# Patient Record
Sex: Male | Born: 1975
Health system: Southern US, Community
[De-identification: ages and names within clinical notes are randomized; demographics above are authoritative.]

## PROBLEM LIST (undated history)

## (undated) DIAGNOSIS — E7800 Pure hypercholesterolemia, unspecified: Secondary | ICD-10-CM

## (undated) DIAGNOSIS — S36039A Unspecified laceration of spleen, initial encounter: Secondary | ICD-10-CM

## (undated) HISTORY — DX: Pure hypercholesterolemia, unspecified: E78.00

## (undated) HISTORY — PX: FRENULECTOMY, LINGUAL: SHX1681

## (undated) HISTORY — PX: OTHER SURGICAL HISTORY: SHX169

## (undated) HISTORY — DX: Unspecified laceration of spleen, initial encounter: S36.039A

---

## 1997-10-20 ENCOUNTER — Emergency Department (HOSPITAL_COMMUNITY): Admission: EM | Admit: 1997-10-20 | Discharge: 1997-10-21 | Payer: Self-pay | Admitting: Emergency Medicine

## 1997-10-21 ENCOUNTER — Encounter: Payer: Self-pay | Admitting: Emergency Medicine

## 1998-07-10 ENCOUNTER — Emergency Department (HOSPITAL_COMMUNITY): Admission: EM | Admit: 1998-07-10 | Discharge: 1998-07-10 | Payer: Self-pay | Admitting: Internal Medicine

## 1999-02-11 ENCOUNTER — Emergency Department (HOSPITAL_COMMUNITY): Admission: EM | Admit: 1999-02-11 | Discharge: 1999-02-11 | Payer: Self-pay | Admitting: *Deleted

## 1999-05-22 ENCOUNTER — Emergency Department (HOSPITAL_COMMUNITY): Admission: EM | Admit: 1999-05-22 | Discharge: 1999-05-22 | Payer: Self-pay | Admitting: Internal Medicine

## 1999-12-25 ENCOUNTER — Emergency Department (HOSPITAL_COMMUNITY): Admission: EM | Admit: 1999-12-25 | Discharge: 1999-12-25 | Payer: Self-pay | Admitting: Internal Medicine

## 2002-07-17 ENCOUNTER — Emergency Department (HOSPITAL_COMMUNITY): Admission: EM | Admit: 2002-07-17 | Discharge: 2002-07-17 | Payer: Self-pay | Admitting: Emergency Medicine

## 2002-12-16 ENCOUNTER — Emergency Department (HOSPITAL_COMMUNITY): Admission: EM | Admit: 2002-12-16 | Discharge: 2002-12-16 | Payer: Self-pay | Admitting: Emergency Medicine

## 2003-04-03 ENCOUNTER — Emergency Department (HOSPITAL_COMMUNITY): Admission: EM | Admit: 2003-04-03 | Discharge: 2003-04-03 | Payer: Self-pay | Admitting: Family Medicine

## 2003-04-09 ENCOUNTER — Emergency Department (HOSPITAL_COMMUNITY): Admission: AD | Admit: 2003-04-09 | Discharge: 2003-04-09 | Payer: Self-pay | Admitting: Family Medicine

## 2004-07-25 ENCOUNTER — Emergency Department (HOSPITAL_COMMUNITY): Admission: EM | Admit: 2004-07-25 | Discharge: 2004-07-25 | Payer: Self-pay | Admitting: Family Medicine

## 2005-01-17 HISTORY — PX: ORIF PELVIC FRACTURE: SUR948

## 2005-11-10 ENCOUNTER — Emergency Department (HOSPITAL_COMMUNITY): Admission: EM | Admit: 2005-11-10 | Discharge: 2005-11-10 | Payer: Self-pay | Admitting: Family Medicine

## 2006-08-02 ENCOUNTER — Inpatient Hospital Stay (HOSPITAL_COMMUNITY): Admission: EM | Admit: 2006-08-02 | Discharge: 2006-08-07 | Payer: Self-pay | Admitting: Emergency Medicine

## 2006-10-26 ENCOUNTER — Ambulatory Visit (HOSPITAL_COMMUNITY): Admission: RE | Admit: 2006-10-26 | Discharge: 2006-10-26 | Payer: Self-pay | Admitting: Orthopedic Surgery

## 2007-06-09 ENCOUNTER — Emergency Department (HOSPITAL_COMMUNITY): Admission: EM | Admit: 2007-06-09 | Discharge: 2007-06-09 | Payer: Self-pay | Admitting: Emergency Medicine

## 2008-03-10 ENCOUNTER — Emergency Department (HOSPITAL_COMMUNITY): Admission: EM | Admit: 2008-03-10 | Discharge: 2008-03-10 | Payer: Self-pay | Admitting: Emergency Medicine

## 2009-09-20 ENCOUNTER — Emergency Department (HOSPITAL_COMMUNITY): Admission: EM | Admit: 2009-09-20 | Discharge: 2009-09-20 | Payer: Self-pay | Admitting: Emergency Medicine

## 2010-03-27 ENCOUNTER — Emergency Department (HOSPITAL_BASED_OUTPATIENT_CLINIC_OR_DEPARTMENT_OTHER)
Admission: EM | Admit: 2010-03-27 | Discharge: 2010-03-27 | Disposition: A | Discharge status: Home / Self Care | Payer: Self-pay | Attending: Emergency Medicine | Admitting: Emergency Medicine

## 2010-03-27 DIAGNOSIS — F17200 Nicotine dependence, unspecified, uncomplicated: Secondary | ICD-10-CM | POA: Insufficient documentation

## 2010-03-27 DIAGNOSIS — L089 Local infection of the skin and subcutaneous tissue, unspecified: Secondary | ICD-10-CM | POA: Insufficient documentation

## 2010-05-04 LAB — COMPREHENSIVE METABOLIC PANEL
CO2: 27 mEq/L (ref 19–32)
Calcium: 9.3 mg/dL (ref 8.4–10.5)
Creatinine, Ser: 0.64 mg/dL (ref 0.4–1.5)
GFR calc Af Amer: >60 mL/min (ref >60)
GFR calc non Af Amer: >60 mL/min (ref >60)
Glucose, Bld: 79 mg/dL (ref 70–99)

## 2010-05-04 LAB — CBC
MCHC: 34.2 g/dL (ref 30.0–36.0)
MCV: 88 fL (ref 78.0–100.0)
RBC: 5.47 MIL/uL (ref 4.22–5.81)
RDW: 13.2 % (ref 11.5–15.5)

## 2010-05-04 LAB — APTT: aPTT: 30 seconds (ref 24–37)

## 2010-05-04 LAB — DIFFERENTIAL
Lymphocytes Relative: 35 % (ref 12–46)
Lymphs Abs: 2.4 10*3/uL (ref 0.7–4.0)
Neutrophils Relative %: 55 % (ref 43–77)

## 2010-05-04 LAB — PROTIME-INR
INR: 1 (ref 0.00–1.49)
Prothrombin Time: 13 seconds (ref 11.6–15.2)
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
## 2010-10-28 LAB — CBC
Hemoglobin: 14.9
RBC: 5.24
RDW: 13.9

## 2010-10-28 LAB — BASIC METABOLIC PANEL
Calcium: 9.1
GFR calc Af Amer: >60
GFR calc non Af Amer: >60
Sodium: 137

## 2010-10-28 LAB — PROTIME-INR: INR: 0.9

## 2010-11-01 LAB — TYPE AND SCREEN: Antibody Screen: NEGATIVE

## 2010-11-01 LAB — BASIC METABOLIC PANEL
BUN: 3 — ABNORMAL LOW
BUN: 6
BUN: 7
CO2: 29
CO2: 29
CO2: 31
Calcium: 7.9 — ABNORMAL LOW
Calcium: 8 — ABNORMAL LOW
Calcium: 8.5
Chloride: 100
Chloride: 103
Chloride: 99
Creatinine, Ser: 0.63
Creatinine, Ser: 0.67
Creatinine, Ser: 0.7
Creatinine, Ser: 0.71
GFR calc Af Amer: >60
GFR calc Af Amer: >60
GFR calc non Af Amer: >60
GFR calc non Af Amer: >60
Potassium: 3.8
Potassium: 3.9
Potassium: 4.1
Sodium: 134 — ABNORMAL LOW
Sodium: 135

## 2010-11-01 LAB — CBC
HCT: 28.2 — ABNORMAL LOW
HCT: 34.1 — ABNORMAL LOW
HCT: 35.5 — ABNORMAL LOW
HCT: 38.2 — ABNORMAL LOW
Hemoglobin: 13.2
Hemoglobin: 9.7 — ABNORMAL LOW
Hemoglobin: 9.7 — ABNORMAL LOW
MCHC: 33.9
MCHC: 33.9
MCHC: 34.3
MCHC: 34.3
MCHC: 34.4
MCV: 86.4
MCV: 86.4
MCV: 86.8
MCV: 86.8
Platelets: 146 — ABNORMAL LOW
Platelets: 149 — ABNORMAL LOW
Platelets: 169
Platelets: 176
Platelets: 218
RBC: 3.27 — ABNORMAL LOW
RBC: 3.29 — ABNORMAL LOW
RBC: 3.95 — ABNORMAL LOW
RBC: 4.4
RBC: 4.74
RDW: 13.3
WBC: 10.2
WBC: 6.5
WBC: 6.5
WBC: 7.3
WBC: 8.9

## 2010-11-01 LAB — I-STAT 8, (EC8 V) (CONVERTED LAB)
Acid-base deficit: 1
Chloride: 103
HCT: 48
Operator id: 198171
Potassium: 3.4 — ABNORMAL LOW
pCO2, Ven: 55.1 — ABNORMAL HIGH

## 2010-11-01 LAB — URINALYSIS, ROUTINE W REFLEX MICROSCOPIC
Ketones, ur: 40 — AB
Nitrite: NEGATIVE
Protein, ur: NEGATIVE
pH: 8

## 2010-11-01 LAB — URINE CULTURE: Special Requests: NEGATIVE

## 2010-11-01 LAB — ABO/RH: ABO/RH(D): A POS

## 2010-11-01 LAB — PROTIME-INR: Prothrombin Time: 14

## 2010-11-01 LAB — POCT I-STAT CREATININE: Operator id: 198171

## 2012-02-08 ENCOUNTER — Encounter (HOSPITAL_BASED_OUTPATIENT_CLINIC_OR_DEPARTMENT_OTHER): Payer: Self-pay | Admitting: *Deleted

## 2012-02-08 ENCOUNTER — Emergency Department (HOSPITAL_BASED_OUTPATIENT_CLINIC_OR_DEPARTMENT_OTHER)
Admission: EM | Admit: 2012-02-08 | Discharge: 2012-02-08 | Disposition: A | Discharge status: Home / Self Care | Payer: Self-pay | Attending: Emergency Medicine | Admitting: Emergency Medicine

## 2012-02-08 DIAGNOSIS — R05 Cough: Secondary | ICD-10-CM | POA: Insufficient documentation

## 2012-02-08 DIAGNOSIS — Z87891 Personal history of nicotine dependence: Secondary | ICD-10-CM | POA: Insufficient documentation

## 2012-02-08 DIAGNOSIS — J029 Acute pharyngitis, unspecified: Secondary | ICD-10-CM | POA: Insufficient documentation

## 2012-02-08 DIAGNOSIS — J209 Acute bronchitis, unspecified: Secondary | ICD-10-CM | POA: Insufficient documentation

## 2012-02-08 DIAGNOSIS — R059 Cough, unspecified: Secondary | ICD-10-CM | POA: Insufficient documentation

## 2012-02-08 DIAGNOSIS — J069 Acute upper respiratory infection, unspecified: Secondary | ICD-10-CM | POA: Insufficient documentation

## 2012-02-08 MED ORDER — AZITHROMYCIN 250 MG PO TABS: ORAL_TABLET | ORAL | Status: DC

## 2012-02-08 NOTE — ED Provider Notes (Signed)
History     CSN: [id number]  Arrival date & time 02/08/12  8315   First MD Initiated Contact with Patient 02/08/12 0940      Chief Complaint  Patient presents with  . URI    (Consider location/radiation/quality/duration/timing/severity/associated sxs/prior treatment) Patient is a 37 y.o. male presenting with URI. The history is provided by the patient and medical records. No language interpreter was used.  URI The primary symptoms include sore throat and cough. Primary symptoms do not include fever. The current episode started 3 to 5 days ago. This is a new problem. The problem has not changed since onset. The sore throat began more than 2 days ago. The sore throat has been unchanged since its onset. The sore throat is mild in intensity.  The cough began 3 to 5 days ago. The cough is new. The cough is non-productive.  The onset of the illness is associated with exposure to sick contacts (His daughter has "walking pneumonia."). The following treatments were addressed: A decongestant was effective.    History reviewed. No pertinent past medical history.  Past Surgical History  Procedure Date  . Orif pelvic fracture   . Frenulectomy, lingual     History reviewed. No pertinent family history.  History  Substance Use Topics  . Smoking status: Former Research scientist (life sciences)  . Smokeless tobacco: Not on file  . Alcohol Use: No      Review of Systems  Constitutional: Negative.  Negative for fever.  HENT: Positive for sore throat.   Eyes: Negative.   Respiratory: Positive for cough.   Cardiovascular: Negative.   Gastrointestinal: Negative.   Genitourinary: Negative.   Musculoskeletal: Negative.   Neurological: Negative.   Psychiatric/Behavioral: Negative.     Allergies  Review of patient's allergies indicates no known allergies.  Home Medications   Current Outpatient Rx  Name  Route  Sig  Dispense  Refill  . NYQUIL PO   Oral   Take by mouth.           BP 126/89  Pulse 60   Temp 98.1 F (36.7 C) (Oral)  Resp 18  Ht 5' 9"  (1.753 m)  Wt 160 lb (72.576 kg)  BMI 23.63 kg/m2  SpO2 100%  Physical Exam  Nursing note and vitals reviewed. Constitutional: He is oriented to person, place, and time. He appears well-developed and well-nourished. No distress.  HENT:  Head: Normocephalic and atraumatic.  Right Ear: External ear normal.       Throat red, no exudate.  Eyes: Conjunctivae normal and EOM are normal. Pupils are equal, round, and reactive to light.  Neck: Normal range of motion. Neck supple.  Cardiovascular: Normal rate, regular rhythm and normal heart sounds.   Pulmonary/Chest: Effort normal and breath sounds normal.  Abdominal: Soft. Bowel sounds are normal.  Musculoskeletal: Normal range of motion.  Lymphadenopathy:    He has cervical adenopathy.  Neurological: He is alert and oriented to person, place, and time.       No sensory or motor deficit.  Skin: Skin is warm and dry.  Psychiatric: He has a normal mood and affect. His behavior is normal.    ED Course  Procedures (including critical care time)  Rx for bronchitis with Z-Pak.    1. URI (upper respiratory infection)   2. Acute bronchitis           Mylinda Latina III, MD 02/08/12 [phone]

## 2012-02-08 NOTE — ED Notes (Signed)
Patient states he has had sinus congestion, headache and sore throat for the last 4 days.  Denies fever.  Intermittent non productive cough.  Using OTC cold meds with no relief.

## 2012-05-30 ENCOUNTER — Encounter (HOSPITAL_BASED_OUTPATIENT_CLINIC_OR_DEPARTMENT_OTHER): Payer: Self-pay | Admitting: *Deleted

## 2012-05-30 ENCOUNTER — Emergency Department (HOSPITAL_BASED_OUTPATIENT_CLINIC_OR_DEPARTMENT_OTHER): Payer: Self-pay

## 2012-05-30 ENCOUNTER — Emergency Department (HOSPITAL_BASED_OUTPATIENT_CLINIC_OR_DEPARTMENT_OTHER)
Admission: EM | Admit: 2012-05-30 | Discharge: 2012-05-30 | Disposition: A | Discharge status: Home / Self Care | Payer: Self-pay | Attending: Emergency Medicine | Admitting: Emergency Medicine

## 2012-05-30 DIAGNOSIS — S61209A Unspecified open wound of unspecified finger without damage to nail, initial encounter: Secondary | ICD-10-CM | POA: Insufficient documentation

## 2012-05-30 DIAGNOSIS — Z79899 Other long term (current) drug therapy: Secondary | ICD-10-CM | POA: Insufficient documentation

## 2012-05-30 DIAGNOSIS — Z87891 Personal history of nicotine dependence: Secondary | ICD-10-CM | POA: Insufficient documentation

## 2012-05-30 DIAGNOSIS — Y939 Activity, unspecified: Secondary | ICD-10-CM | POA: Insufficient documentation

## 2012-05-30 DIAGNOSIS — S61012A Laceration without foreign body of left thumb without damage to nail, initial encounter: Secondary | ICD-10-CM

## 2012-05-30 DIAGNOSIS — W298XXA Contact with other powered powered hand tools and household machinery, initial encounter: Secondary | ICD-10-CM | POA: Insufficient documentation

## 2012-05-30 DIAGNOSIS — Y929 Unspecified place or not applicable: Secondary | ICD-10-CM | POA: Insufficient documentation

## 2012-05-30 MED ORDER — LIDOCAINE HCL 2 % IJ SOLN
INTRAMUSCULAR | Status: AC
  Administered 2012-05-30: 19:00:00
  Filled 2012-05-30: qty 20

## 2012-05-30 MED ORDER — OXYCODONE-ACETAMINOPHEN 5-325 MG PO TABS: 2.0000 | ORAL_TABLET | ORAL | Status: DC | PRN

## 2012-05-30 MED ORDER — SULFAMETHOXAZOLE-TRIMETHOPRIM 800-160 MG PO TABS: 1.0000 | ORAL_TABLET | Freq: Two times a day (BID) | ORAL | Status: DC

## 2012-05-30 MED ORDER — HYDROCODONE-ACETAMINOPHEN 5-325 MG PO TABS: 2.0000 | ORAL_TABLET | ORAL | Status: DC | PRN

## 2012-05-30 NOTE — ED Notes (Signed)
Pt reports laceration to left thumb by chainsaw x 1 hr ago

## 2012-05-30 NOTE — ED Provider Notes (Signed)
History     CSN: [id number]  Arrival date & time 05/30/12  1649   None     Chief Complaint  Patient presents with  . Laceration    (Consider location/radiation/quality/duration/timing/severity/associated sxs/prior treatment) Patient is a 37 y.o. male presenting with skin laceration. The history is provided by the patient. No language interpreter was used.  Laceration Location:  Finger Length (cm):  1 Quality: stellate   Bleeding: controlled with pressure   Injury mechanism: chain saw. Tetanus status:  Up to date Pt cut end of finger with a chainsaw  History reviewed. No pertinent past medical history.  Past Surgical History  Procedure Laterality Date  . Orif pelvic fracture    . Frenulectomy, lingual    .  plevis      History reviewed. No pertinent family history.  History  Substance Use Topics  . Smoking status: Former Research scientist (life sciences)  . Smokeless tobacco: Not on file  . Alcohol Use: No      Review of Systems  Skin: Positive for wound.  All other systems reviewed and are negative.    Allergies  Review of patient's allergies indicates no known allergies.  Home Medications   Current Outpatient Rx  Name  Route  Sig  Dispense  Refill  . azithromycin (ZITHROMAX Z-PAK) 250 MG tablet      Take 2 tablets today, then take 1 tablet once a day for the next 4 days.   6 tablet   0   . Pseudoeph-Doxylamine-DM-APAP (NYQUIL PO)   Oral   Take by mouth.           BP 140/100  Pulse 92  Temp(Src) 99.2 F (37.3 C) (Oral)  Resp 16  Ht 5' 9"  (1.753 m)  Wt 165 lb (74.844 kg)  BMI 24.36 kg/m2  SpO2 100%  Physical Exam  Nursing note and vitals reviewed. Constitutional: He is oriented to person, place, and time. He appears well-developed and well-nourished.  HENT:  Head: Normocephalic.  Musculoskeletal: He exhibits tenderness.  Irregular ugly laceration thumb,  nv intact  Neurological: He is alert and oriented to person, place, and time. He has normal reflexes.   Skin: Skin is warm.  Psychiatric: He has a normal mood and affect.    ED Course  LACERATION REPAIR Date/Time: 05/30/2012 6:45 PM Performed by: Fransico Meadow Authorized by: Fransico Meadow Consent: Verbal consent obtained. Risks and benefits: risks, benefits and alternatives were discussed Consent given by: patient Patient identity confirmed: verbally with patient Time out: Immediately prior to procedure a "time out" was called to verify the correct patient, procedure, equipment, support staff and site/side marked as required. Body area: upper extremity Location details: left thumb Anesthesia: local infiltration and digital block Subcutaneous closure: 5-0 Vicryl Number of sutures: 5 Technique: simple Approximation: close Approximation difficulty: simple Patient tolerance: Patient tolerated the procedure well with no immediate complications.   (including critical care time)  Labs Reviewed - No data to display No results found.   1. Laceration of left thumb with complication, initial encounter       MDM  Recheck in 2 days,   Rx for percocet and bactrim        Fransico Meadow, PA-C 05/30/12 1846

## 2012-05-30 NOTE — ED Provider Notes (Signed)
Medical screening examination/treatment/procedure(s) were performed by non-physician practitioner and as supervising physician I was immediately available for consultation/collaboration.   Malvin Johns, MD 05/30/12 2038

## 2012-10-03 ENCOUNTER — Emergency Department (HOSPITAL_BASED_OUTPATIENT_CLINIC_OR_DEPARTMENT_OTHER)
Admission: EM | Admit: 2012-10-03 | Discharge: 2012-10-03 | Disposition: A | Discharge status: Home / Self Care | Payer: Self-pay | Attending: Emergency Medicine | Admitting: Emergency Medicine

## 2012-10-03 ENCOUNTER — Encounter (HOSPITAL_BASED_OUTPATIENT_CLINIC_OR_DEPARTMENT_OTHER): Payer: Self-pay

## 2012-10-03 DIAGNOSIS — M5430 Sciatica, unspecified side: Secondary | ICD-10-CM | POA: Insufficient documentation

## 2012-10-03 DIAGNOSIS — Z87891 Personal history of nicotine dependence: Secondary | ICD-10-CM | POA: Insufficient documentation

## 2012-10-03 DIAGNOSIS — M5431 Sciatica, right side: Secondary | ICD-10-CM

## 2012-10-03 DIAGNOSIS — M545 Low back pain: Secondary | ICD-10-CM

## 2012-10-03 MED ORDER — CYCLOBENZAPRINE HCL 10 MG PO TABS: 10.0000 mg | ORAL_TABLET | Freq: Two times a day (BID) | ORAL | Status: DC | PRN

## 2012-10-03 MED ORDER — OXYCODONE-ACETAMINOPHEN 5-325 MG PO TABS: 1.0000 | ORAL_TABLET | Freq: Four times a day (QID) | ORAL | Status: DC | PRN

## 2012-10-03 MED ORDER — METHYLPREDNISOLONE 4 MG PO KIT: PACK | ORAL | Status: DC

## 2012-10-03 NOTE — ED Notes (Signed)
Pt reports low back pain that started Monday am.  Denies injury.

## 2012-10-03 NOTE — ED Provider Notes (Addendum)
CSN: [id number]     Arrival date & time 10/03/12  30 History   First MD Initiated Contact with Patient 10/03/12 1104     Chief Complaint  Patient presents with  . Back Pain   (Consider location/radiation/quality/duration/timing/severity/associated sxs/prior Treatment) Patient is a 37 y.o. male presenting with back pain. The history is provided by the patient.  Back Pain Location:  Lumbar spine Quality:  Aching, stiffness and stabbing Stiffness is present:  All day Radiates to:  R thigh Pain severity:  Moderate Pain is:  Same all the time Onset quality:  Gradual Duration:  3 days Timing:  Constant Progression:  Worsening Chronicity:  New Context: lifting heavy objects and twisting   Relieved by:  Nothing Worsened by:  Bending and lying down Ineffective treatments:  Bed rest, heating pad, ibuprofen and narcotics Associated symptoms: no abdominal pain, no bladder incontinence, no bowel incontinence, no fever, no numbness, no tingling and no weakness   Associated symptoms comment:  No dysuria  Risk factors: no hx of cancer     History reviewed. No pertinent past medical history. Past Surgical History  Procedure Laterality Date  . Orif pelvic fracture    . Frenulectomy, lingual    .  plevis     No family history on file. History  Substance Use Topics  . Smoking status: Former Research scientist (life sciences)  . Smokeless tobacco: Not on file  . Alcohol Use: No    Review of Systems  Constitutional: Negative for fever.  Respiratory: Negative for cough and shortness of breath.   Gastrointestinal: Negative for abdominal pain and bowel incontinence.  Genitourinary: Negative for bladder incontinence.  Musculoskeletal: Positive for back pain.  Neurological: Negative for tingling, weakness and numbness.  All other systems reviewed and are negative.    Allergies  Review of patient's allergies indicates no known allergies.  Home Medications   Current Outpatient Rx  Name  Route  Sig  Dispense   Refill  . oxyCODONE-acetaminophen (PERCOCET/ROXICET) 5-325 MG per tablet   Oral   Take 2 tablets by mouth every 4 (four) hours as needed for pain.   16 tablet   0    BP 144/91  Pulse 86  Temp(Src) 97.5 F (36.4 C) (Oral)  Resp 14  Ht 5' 9"  (1.753 m)  Wt 155 lb (70.308 kg)  BMI 22.88 kg/m2  SpO2 96% Physical Exam  Nursing note and vitals reviewed. Constitutional: He is oriented to person, place, and time. He appears well-developed and well-nourished. No distress.  HENT:  Head: Normocephalic and atraumatic.  Mouth/Throat: Oropharynx is clear and moist.  Eyes: EOM are normal. Pupils are equal, round, and reactive to light.  Neck: Normal range of motion. Neck supple. No spinous process tenderness and no muscular tenderness present. No rigidity. Normal range of motion present.  Cardiovascular: Normal rate, regular rhythm, normal heart sounds and intact distal pulses.   No murmur heard. Pulmonary/Chest: Effort normal and breath sounds normal. He has no wheezes. He has no rales.  Abdominal: Soft. He exhibits no distension. There is no tenderness. There is no CVA tenderness.  Musculoskeletal: He exhibits no tenderness.       Lumbar back: He exhibits decreased range of motion and pain. He exhibits no swelling, no deformity and normal pulse.       Back:  Neurological: He is alert and oriented to person, place, and time. He has normal strength. No sensory deficit. Coordination normal.  Reflex Scores:      Patellar reflexes are 1+ on  the right side and 1+ on the left side. Skin: Skin is warm and dry. No rash noted.  Psychiatric: He has a normal mood and affect.    ED Course  Procedures (including critical care time) Labs Review Labs Reviewed - No data to display Imaging Review No results found.  MDM   1. Sciatica of right side   2. Lumbar pain     Pt with gradual onset of back pain suggestive of radiculopathy.  No neurovascular compromise and no incontinence.  Pt has no  infectious sx, hx of CA  or other red flags concerning for pathologic back pain.  Pt is able to ambulate but is painful.  Normal strength and reflexes on exam.  Denies trauma. Will give pt pain control and to return for developement of above sx.     Blanchie Dessert, MD 10/03/12 1118  Blanchie Dessert, MD 10/03/12 1120

## 2013-08-17 ENCOUNTER — Emergency Department (HOSPITAL_COMMUNITY)
Admission: EM | Admit: 2013-08-17 | Discharge: 2013-08-17 | Disposition: A | Discharge status: Home / Self Care | Payer: Self-pay | Attending: Emergency Medicine | Admitting: Emergency Medicine

## 2013-08-17 ENCOUNTER — Encounter (HOSPITAL_COMMUNITY): Payer: Self-pay | Admitting: Emergency Medicine

## 2013-08-17 DIAGNOSIS — M545 Low back pain, unspecified: Secondary | ICD-10-CM | POA: Insufficient documentation

## 2013-08-17 DIAGNOSIS — Z87891 Personal history of nicotine dependence: Secondary | ICD-10-CM | POA: Insufficient documentation

## 2013-08-17 MED ORDER — KETOROLAC TROMETHAMINE 60 MG/2ML IM SOLN
30.0000 mg | Freq: Once | INTRAMUSCULAR | Status: AC
  Administered 2013-08-17: 30 mg via INTRAMUSCULAR
  Filled 2013-08-17: qty 2

## 2013-08-17 MED ORDER — PREDNISONE 20 MG PO TABS: ORAL_TABLET | ORAL | Status: DC

## 2013-08-17 MED ORDER — OXYCODONE-ACETAMINOPHEN 5-325 MG PO TABS: ORAL_TABLET | ORAL | Status: DC

## 2013-08-17 NOTE — ED Notes (Signed)
Lower back pain.  Took Advil with no relief.

## 2013-08-17 NOTE — ED Provider Notes (Signed)
Medical screening examination/treatment/procedure(s) were performed by non-physician practitioner and as supervising physician I was immediately available for consultation/collaboration  Neta Ehlers, MD 08/17/13 1102

## 2013-08-17 NOTE — ED Provider Notes (Signed)
CSN: [id number]     Arrival date & time 08/17/13  7829 History   First MD Initiated Contact with Patient 08/17/13 [phone]     Chief Complaint  Patient presents with  . Back Pain     (Consider location/radiation/quality/duration/timing/severity/associated sxs/prior Treatment) HPI  Devon Cantu is a 38 y.o. male complaining of lower midline back pain worsening over the course of the week. This is atraumatic, no heavy straining or lifting. Patient had back pain in the past but never this severe. He's been taking Advil, total relief. The pain was so severe last night that stopped him from sleeping. Denies fever, chills, change in bowel or bladder habits, h/o IDVU or cancer, numbness or weakness.   History reviewed. No pertinent past medical history. Past Surgical History  Procedure Laterality Date  . Orif pelvic fracture    . Frenulectomy, lingual    .  plevis     No family history on file. History  Substance Use Topics  . Smoking status: Former Research scientist (life sciences)  . Smokeless tobacco: Not on file  . Alcohol Use: No    Review of Systems  10 systems reviewed and found to be negative, except as noted in the HPI.  Allergies  Review of patient's allergies indicates no known allergies.  Home Medications   Prior to Admission medications   Medication Sig Start Date End Date Taking? Authorizing Provider  oxyCODONE-acetaminophen (PERCOCET/ROXICET) 5-325 MG per tablet 1 to 2 tabs PO q6hrs  PRN for pain 08/17/13   Elmyra Ricks Lacharles Altschuler, PA-C  predniSONE (DELTASONE) 20 MG tablet 3 tabs po daily x 3 days, then 2 tabs x 3 days, then 1.5 tabs x 3 days, then 1 tab x 3 days, then 0.5 tabs x 3 days 08/17/13   Elmyra Ricks Zorianna Taliaferro, PA-C   BP 136/87  Pulse 76  Temp(Src) 97.7 F (36.5 C) (Oral)  Resp 18  Ht 5' 9"  (1.753 m)  Wt 155 lb (70.308 kg)  BMI 22.88 kg/m2  SpO2 100% Physical Exam  Nursing note and vitals reviewed. Constitutional: He appears well-developed and well-nourished.  HENT:  Head:  Normocephalic.  Eyes: Conjunctivae are normal.  Neck: Normal range of motion.  Cardiovascular: Normal rate, regular rhythm and intact distal pulses.   Pulmonary/Chest: Effort normal.  Abdominal: Soft. There is no tenderness.  Neurological: He is alert.  No point tenderness to percussion of lumbar spinal processes.  No TTP or paraspinal muscular spasm. Strength is 5 out of 5 to bilateral lower extremities at hip and knee; extensor hallucis longus 5 out of 5. Ankle strength 5 out of 5, no clonus, neurovascularly intact. No saddle anaesthesia. Patellar reflexes are 2+ bilaterally.    Strait leg raise negative bilaterally Ambulates  With mildly antalgic gait   Psychiatric: He has a normal mood and affect.    ED Course  Procedures (including critical care time) Labs Review Labs Reviewed - No data to display  Imaging Review No results found.   EKG Interpretation None      MDM   Final diagnoses:  Midline low back pain without sciatica    Filed Vitals:   08/17/13 0645  BP: 136/87  Pulse: 76  Temp: 97.7 F (36.5 C)  TempSrc: Oral  Resp: 18  Height: 5' 9"  (1.753 m)  Weight: 155 lb (70.308 kg)  SpO2: 100%    Medications  ketorolac (TORADOL) injection 30 mg (30 mg Intramuscular Given 08/17/13 0716)    Devon Cantu is a 38 y.o. male presenting with  back  pain.  No neurological deficits and normal neuro exam.  Patient can walk but states is painful.  No loss of bowel or bladder control.  No concern for cauda equina.  No fever, night sweats, weight loss, h/o cancer, IVDU.  RICE protocol and pain medicine indicated and discussed with patient.   Evaluation does not show pathology that would require ongoing emergent intervention or inpatient treatment. Pt is hemodynamically stable and mentating appropriately. Discussed findings and plan with patient/guardian, who agrees with care plan. All questions answered. Return precautions discussed and outpatient follow up given.    New Prescriptions   OXYCODONE-ACETAMINOPHEN (PERCOCET/ROXICET) 5-325 MG PER TABLET    1 to 2 tabs PO q6hrs  PRN for pain   PREDNISONE (DELTASONE) 20 MG TABLET    3 tabs po daily x 3 days, then 2 tabs x 3 days, then 1.5 tabs x 3 days, then 1 tab x 3 days, then 0.5 tabs x 3 days         Monico Blitz, PA-C 08/17/13 [phone]

## 2013-08-17 NOTE — Discharge Instructions (Signed)
Please take ibuprofen 452m (this is normally 2 over the counter pills) every 6 hours (take with food to minimze stomach irritation).   Take  percocet for breakthrough pain, do not drink alcohol, drive, care for children or perfom other critical tasks while taking percocet.  Please follow with your primary care doctor in the next 2 days for a check-up. They must obtain records for further management.   Do not hesitate to return to the Emergency Department for any new, worsening or concerning symptoms.

## 2016-08-17 ENCOUNTER — Ambulatory Visit (HOSPITAL_COMMUNITY)
Admission: EM | Admit: 2016-08-17 | Discharge: 2016-08-17 | Disposition: A | Discharge status: Home / Self Care | Payer: Self-pay | Attending: Family Medicine | Admitting: Family Medicine

## 2016-08-17 ENCOUNTER — Encounter (HOSPITAL_COMMUNITY): Payer: Self-pay | Admitting: Emergency Medicine

## 2016-08-17 DIAGNOSIS — J029 Acute pharyngitis, unspecified: Secondary | ICD-10-CM

## 2016-08-17 NOTE — ED Triage Notes (Signed)
Sore throat and joint aches for 3 days

## 2016-08-17 NOTE — Discharge Instructions (Signed)
You may use over the counter ibuprofen or acetaminophen as needed.  In addition you may try [employer] Peroxyl gargle solution or Chloraseptic throat spray.

## 2016-08-18 ENCOUNTER — Emergency Department (HOSPITAL_COMMUNITY)
Admission: EM | Admit: 2016-08-18 | Discharge: 2016-08-18 | Disposition: A | Discharge status: Home / Self Care | Payer: Self-pay | Attending: Emergency Medicine | Admitting: Emergency Medicine

## 2016-08-18 ENCOUNTER — Encounter (HOSPITAL_COMMUNITY): Payer: Self-pay

## 2016-08-18 ENCOUNTER — Emergency Department (HOSPITAL_COMMUNITY): Payer: Self-pay

## 2016-08-18 DIAGNOSIS — J029 Acute pharyngitis, unspecified: Secondary | ICD-10-CM | POA: Insufficient documentation

## 2016-08-18 DIAGNOSIS — H65192 Other acute nonsuppurative otitis media, left ear: Secondary | ICD-10-CM | POA: Insufficient documentation

## 2016-08-18 DIAGNOSIS — R509 Fever, unspecified: Secondary | ICD-10-CM

## 2016-08-18 DIAGNOSIS — H6690 Otitis media, unspecified, unspecified ear: Secondary | ICD-10-CM

## 2016-08-18 DIAGNOSIS — F17210 Nicotine dependence, cigarettes, uncomplicated: Secondary | ICD-10-CM | POA: Insufficient documentation

## 2016-08-18 LAB — RAPID STREP SCREEN (MED CTR MEBANE ONLY): STREPTOCOCCUS, GROUP A SCREEN (DIRECT): NEGATIVE

## 2016-08-18 MED ORDER — IBUPROFEN 800 MG PO TABS
800.0000 mg | ORAL_TABLET | Freq: Once | ORAL | Status: AC
  Administered 2016-08-18: 800 mg via ORAL
  Filled 2016-08-18: qty 1

## 2016-08-18 MED ORDER — DOXYCYCLINE HYCLATE 100 MG PO TABS
200.0000 mg | ORAL_TABLET | Freq: Once | ORAL | Status: AC
  Administered 2016-08-18: 200 mg via ORAL
  Filled 2016-08-18: qty 2

## 2016-08-18 MED ORDER — CLINDAMYCIN HCL 150 MG PO CAPS: 300.0000 mg | ORAL_CAPSULE | Freq: Three times a day (TID) | ORAL | 0 refills | Status: AC

## 2016-08-18 MED ORDER — ACETAMINOPHEN 500 MG PO TABS
1000.0000 mg | ORAL_TABLET | Freq: Once | ORAL | Status: AC
  Administered 2016-08-18: 1000 mg via ORAL
  Filled 2016-08-18: qty 2

## 2016-08-18 MED ORDER — DEXAMETHASONE SODIUM PHOSPHATE 10 MG/ML IJ SOLN
10.0000 mg | Freq: Once | INTRAMUSCULAR | Status: AC
  Administered 2016-08-18: 10 mg via INTRAMUSCULAR
  Filled 2016-08-18: qty 1

## 2016-08-18 NOTE — ED Provider Notes (Signed)
[place] DEPT Provider Note   CSN: [id number] Arrival date & time: 08/18/16  1400     History   Chief Complaint Chief Complaint  Patient presents with  . Sore Throat  . Generalized Body Aches    HPI Devon Cantu is a 41 y.o. male.  HPI Patient presents to ED for evaluation of sore throat and body aches for the past 4 days. He was seen at urgent care yesterday and was told he had viral pharyngitis. He is continuing to take the Advil as directed around the clock. He returns today because he is continuing to have sore throat and generalized body aches. He denies any trismus, drooling, neck pain but states that throat pain is worse with swallowing. He is unsure if he has had a fever because he has been taking the Advil but does report some chills for the past 2 days. 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 He denies any sick contacts, chest pain, trouble breathing, trouble swallowing, headache injury, recent travel, neck pain.  History reviewed. No pertinent past medical history.  There are no active problems to display for this patient.   Past Surgical History:  Procedure Laterality Date  .  plevis    . FRENULECTOMY, LINGUAL    . ORIF PELVIC FRACTURE         Home Medications    Prior to Admission medications   Medication Sig Start Date End Date Taking? Authorizing Provider  clindamycin (CLEOCIN) 150 MG capsule Take 2 capsules (300 mg total) by mouth 3 (three) times daily. 08/18/16 08/25/16  Laquan Ludden, PA-C  ibuprofen (ADVIL,MOTRIN) 200 MG tablet Take 200 mg by mouth every 6 (six) hours as needed.    [provider]  oxyCODONE-acetaminophen (PERCOCET/ROXICET) 5-325 MG per tablet 1 to 2 tabs PO q6hrs  PRN for pain 08/17/13   Pisciotta, Elmyra Ricks, PA-C  predniSONE (DELTASONE) 20 MG tablet 3 tabs po daily x 3 days, then 2 tabs x 3 days, then 1.5 tabs x 3 days, then 1 tab x 3 days, then 0.5 tabs x 3 days 08/17/13    Pisciotta, Elmyra Ricks, PA-C    Family History No family history on file.  Social History Social History  Substance Use Topics  . Smoking status: Current Every Day Smoker    Packs/day: 0.50    Types: Cigarettes  . Smokeless tobacco: Never Used  . Alcohol use No     Allergies   Patient has no known allergies.   Review of Systems Review of Systems  Constitutional: Positive for chills, fatigue and fever. Negative for appetite change.  HENT: Positive for sore throat. Negative for sinus pressure, trouble swallowing and voice change.   Eyes: Negative for visual disturbance.  Respiratory: Positive for cough.   Cardiovascular: Negative for chest pain.  Gastrointestinal: Negative for abdominal pain, diarrhea, nausea and vomiting.  Musculoskeletal: Positive for myalgias.  Skin: Negative for rash.  Neurological: Negative for dizziness, weakness, light-headedness, numbness and headaches.     Physical Exam Updated Vital Signs BP (!) 156/95 (BP Location: Left Arm)   Pulse 78   Temp (!) 100.9 F (38.3 C) (Oral)   Resp 16   Ht 5' 9"  (1.753 m)   Wt 70.3 kg (155 lb)   SpO2 100%   BMI 22.89 kg/m   Physical Exam  Constitutional: He appears well-developed and well-nourished. No distress.  HENT:  Head: Normocephalic and atraumatic.  Nose: Nose normal.  Mouth/Throat: Uvula is midline. Posterior oropharyngeal erythema present. No posterior oropharyngeal edema. Tonsillar exudate.  Bilateral tonsillar exudates. Patient does not appear to be in acute distress. No trismus or drooling present. No pooling of secretions. Patient is tolerating secretions and is not in respiratory distress. No neck pain or tenderness to palpation of the neck. Full active and passive range of motion of the neck. No evidence of RPA or PTA.  Eyes: Conjunctivae and EOM are normal. Right eye exhibits no discharge. Left eye exhibits no discharge. No scleral icterus.  Neck: Normal range of motion. Neck supple.    Cardiovascular: Normal rate, regular rhythm, normal heart sounds and intact distal pulses.  Exam reveals no gallop and no friction rub.   No murmur heard. Pulmonary/Chest: Effort normal and breath sounds normal. No respiratory distress.  Abdominal: Soft. Bowel sounds are normal. He exhibits no distension. There is no tenderness. There is no guarding.  Musculoskeletal: Normal range of motion. He exhibits no edema.  Neurological: He is alert. He exhibits normal muscle tone. Coordination normal.  Skin: Skin is warm and dry. No rash noted.  Psychiatric: He has a normal mood and affect.  Nursing note and vitals reviewed.    ED Treatments / Results  Labs (all labs ordered are listed, but only abnormal results are displayed) Labs Reviewed  RAPID STREP SCREEN (NOT AT [facility])  CULTURE, GROUP A STREP [facility])    EKG  EKG Interpretation None       Radiology Dg Chest 2 View  Result Date: 08/18/2016 CLINICAL DATA:  Fever.  Sore throat. EXAM: CHEST  2 VIEW COMPARISON:  No recent prior . FINDINGS: Mediastinum and hilar structures normal. Lungs are clear. Heart size normal. No pleural effusion or pneumothorax. Degenerative changes thoracic spine with mild scoliosis. IMPRESSION: No acute cardiopulmonary disease . Electronically Signed   By: Marcello Moores  Register   On: 08/18/2016 15:52    Procedures Procedures (including critical care time)  Medications Ordered in ED Medications  dexamethasone (DECADRON) injection 10 mg (10 mg Intramuscular Given 08/18/16 1521)  doxycycline (VIBRA-TABS) tablet 200 mg (200 mg Oral Given 08/18/16 1520)  ibuprofen (ADVIL,MOTRIN) tablet 800 mg (800 mg Oral Given 08/18/16 1520)  acetaminophen (TYLENOL) tablet 1,000 mg (1,000 mg Oral Given 08/18/16 1520)     Initial Impression / Assessment and Plan / ED Course  I have reviewed the triage vital signs and the nursing notes.  Pertinent labs & imaging results that were available during my care of the patient were reviewed by me  and considered in my medical decision making (see chart for details).     Patient presents to ED for evaluation of sore throat, generalized body aches and fever. He was seen at urgent care yesterday and has been continuing to take ibuprofen as prescribed. He reports pain with swallowing but no trismus, drooling, neck pain or rashes. He is afebrile to 102.3 here in the ED. There is tonsillar exudates and posterior pharyngeal erythema noted. Uvula is midline. There is no neck tenderness or trouble moving the neck. Strep test was negative. He does have evidence of acute otitis media on the left side. He also complains of productive cough. Chest x-ray was normal. Strep test returned as negative. I have low suspicion for peritonsillar abscess based on symptoms as well as low suspicion for RPA. We'll treat with clindamycin to cover both strep and acute otitis media. Encouraged to continue ibuprofen or Tylenol as needed for fever and pain. 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 given Tylenol and ibuprofen with improvement in temperature to 100.9. Normal heart rate and blood pressure. Patient appears stable for discharge at this time. Strict return precautions given.  Final Clinical Impressions(s) / ED Diagnoses   Final diagnoses:  Pharyngitis, unspecified etiology  Acute otitis media, unspecified otitis media type  Fever, unspecified fever cause  Sore throat    New Prescriptions Discharge Medication List as of 08/18/2016  4:08 PM    START taking these medications   Details  clindamycin (CLEOCIN) 150 MG capsule Take 2 capsules (300 mg total) by mouth 3 (three) times daily., Starting Thu 08/18/2016, Until Thu 08/25/2016, Print         [place], [place], PA-C 08/18/16 Evalee Jefferson    Lajean Saver, MD 08/20/16 [phone]

## 2016-08-18 NOTE — Discharge Instructions (Signed)
Please read attached information regarding your condition. Take clindamycin 3 times daily for 1 week. Continue ibuprofen or Tylenol as needed for fever. Return to ED for worsening pain, trouble swallowing, trouble breathing, drooling, injuries.

## 2016-08-18 NOTE — ED Triage Notes (Signed)
Per Pt, Pt reports sore throat, body aches, and fever x 4 days. Was seen yesterday, but was sent home for OTC medication.

## 2016-08-18 NOTE — ED Notes (Signed)
Body aches and sore throat s several days, has been running a temp

## 2016-08-18 NOTE — ED Notes (Signed)
Declined W/C at D/C and was escorted to lobby by RN. 

## 2016-08-18 NOTE — ED Provider Notes (Signed)
  [place]   [id number] 08/17/16 Arrival Time: 1806  ASSESSMENT & PLAN:  1. Viral pharyngitis     Meds ordered this encounter  Medications  . ibuprofen (ADVIL,MOTRIN) 200 MG tablet    Sig: Take 200 mg by mouth every 6 (six) hours as needed.   If not improving within the next few days will f/u. No sign of bacterial infection.  Reviewed expectations re: course of current medical issues. Questions answered. Outlined signs and symptoms indicating need for more acute intervention. Patient verbalized understanding. After Visit Summary given.   SUBJECTIVE:  Devon Cantu is a 41 y.o. male who presents with complaint of sore throat for 2-3 days. Gradual onset. Body and joint aches. No fever reported. OTC with mild help. Tolerating normal PO intake. No n/v. No sick contacts. No specific aggravating or alleviating factors reported. No neck swelling or pain.  ROS: As per HPI.   OBJECTIVE:  Vitals:   08/17/16 1848  BP: 137/84  Pulse: 97  Resp: 18  Temp: 98.3 F (36.8 C)  TempSrc: Oral  SpO2: 99%     General appearance: alert; no distress HEENT: generalized throat erythema; normal tonsils without exudate Neck: supple without LAD Skin: warm and dry   No Known Allergies  PMHx, SurgHx, SocialHx, Medications, and Allergies were reviewed in the Visit Navigator and updated as appropriate.      Vanessa Kick, MD 08/18/16 1005

## 2016-08-20 LAB — CULTURE, GROUP A STREP (THRC)

## 2016-09-25 ENCOUNTER — Emergency Department (HOSPITAL_BASED_OUTPATIENT_CLINIC_OR_DEPARTMENT_OTHER)
Admission: EM | Admit: 2016-09-25 | Discharge: 2016-09-25 | Disposition: A | Discharge status: Home / Self Care | Payer: Self-pay | Attending: Emergency Medicine | Admitting: Emergency Medicine

## 2016-09-25 ENCOUNTER — Encounter (HOSPITAL_BASED_OUTPATIENT_CLINIC_OR_DEPARTMENT_OTHER): Payer: Self-pay | Admitting: Emergency Medicine

## 2016-09-25 DIAGNOSIS — Y929 Unspecified place or not applicable: Secondary | ICD-10-CM | POA: Insufficient documentation

## 2016-09-25 DIAGNOSIS — S39012A Strain of muscle, fascia and tendon of lower back, initial encounter: Secondary | ICD-10-CM | POA: Insufficient documentation

## 2016-09-25 DIAGNOSIS — Y999 Unspecified external cause status: Secondary | ICD-10-CM | POA: Insufficient documentation

## 2016-09-25 DIAGNOSIS — F17210 Nicotine dependence, cigarettes, uncomplicated: Secondary | ICD-10-CM | POA: Insufficient documentation

## 2016-09-25 DIAGNOSIS — Y93H2 Activity, gardening and landscaping: Secondary | ICD-10-CM | POA: Insufficient documentation

## 2016-09-25 DIAGNOSIS — X509XXA Other and unspecified overexertion or strenuous movements or postures, initial encounter: Secondary | ICD-10-CM | POA: Insufficient documentation

## 2016-09-25 MED ORDER — IBUPROFEN 600 MG PO TABS: 600.0000 mg | ORAL_TABLET | Freq: Four times a day (QID) | ORAL | 0 refills | Status: DC | PRN

## 2016-09-25 MED ORDER — METHOCARBAMOL 500 MG PO TABS
500.0000 mg | ORAL_TABLET | Freq: Once | ORAL | Status: AC
  Administered 2016-09-25: 500 mg via ORAL
  Filled 2016-09-25: qty 1

## 2016-09-25 MED ORDER — IBUPROFEN 400 MG PO TABS
600.0000 mg | ORAL_TABLET | Freq: Once | ORAL | Status: AC
  Administered 2016-09-25: 600 mg via ORAL
  Filled 2016-09-25: qty 1

## 2016-09-25 MED ORDER — METHOCARBAMOL 500 MG PO TABS: 500.0000 mg | ORAL_TABLET | Freq: Three times a day (TID) | ORAL | 0 refills | Status: DC | PRN

## 2016-09-25 NOTE — Discharge Instructions (Signed)
Please take ibuprofen and muscle relaxant for pain. Use heat packs at rest  Avoid heavy lifting or exertional activity.  Return for worsening symptoms, including new numbness or weakness of the legs, escalating pain, or any other symptoms concerning to you.

## 2016-09-25 NOTE — ED Notes (Signed)
ED Provider at bedside. 

## 2016-09-25 NOTE — ED Triage Notes (Signed)
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

## 2016-09-25 NOTE — ED Provider Notes (Signed)
[place] DEPT [facility] Provider Note   CSN: [id number] Arrival date & time: 09/25/16  1314     History   Chief Complaint Chief Complaint  Patient presents with  . Back Pain    HPI Devon Cantu is a 41 y.o. male.  The history is provided by the patient.  Back Pain   This is a new problem. The current episode started more than 2 days ago. The problem occurs constantly. The problem has not changed since onset.Associated with: suddenly standing up while bending down. The pain is present in the lumbar spine. The quality of the pain is described as stabbing. The pain does not radiate. The pain is moderate. The symptoms are aggravated by bending, twisting and certain positions. Pertinent negatives include no chest pain, no fever, no numbness, no abdominal pain, no bowel incontinence, no perianal numbness, no bladder incontinence, no dysuria, no paresis, no tingling and no weakness. He has tried nothing for the symptoms.   41 year old male who presents with low back pain. He states that he was cutting metal on the grass, when he suddenly stood up and felt a pop in his back. Since then has been very sore in the low back and last night with back spasms. Did not have any pain medications. Denies any numbness or weakness in the lower extremity or difficulty walking. Denies any bowel or urinary incontinence or saddle anesthesia. No fevers, nausea or vomiting, abdominal pain, urinary complaints.. No fall or trauma.  History reviewed. No pertinent past medical history.  There are no active problems to display for this patient.   Past Surgical History:  Procedure Laterality Date  .  plevis    . FRENULECTOMY, LINGUAL    . ORIF PELVIC FRACTURE         Home Medications    Prior to Admission medications   Medication Sig Start Date End Date Taking? Authorizing Provider  ibuprofen (ADVIL,MOTRIN) 200 MG tablet Take 200 mg by mouth every 6 (six) hours as needed.    [provider]   ibuprofen (ADVIL,MOTRIN) 600 MG tablet Take 1 tablet (600 mg total) by mouth every 6 (six) hours as needed for moderate pain. 09/25/16   Forde Dandy, MD  methocarbamol (ROBAXIN) 500 MG tablet Take 1 tablet (500 mg total) by mouth every 8 (eight) hours as needed for muscle spasms. 09/25/16   Forde Dandy, MD  oxyCODONE-acetaminophen (PERCOCET/ROXICET) 5-325 MG per tablet 1 to 2 tabs PO q6hrs  PRN for pain 08/17/13   Pisciotta, Elmyra Ricks, PA-C  predniSONE (DELTASONE) 20 MG tablet 3 tabs po daily x 3 days, then 2 tabs x 3 days, then 1.5 tabs x 3 days, then 1 tab x 3 days, then 0.5 tabs x 3 days 08/17/13   Pisciotta, Charna Elizabeth    Family History History reviewed. No pertinent family history.  Social History Social History  Substance Use Topics  . Smoking status: Current Every Day Smoker    Packs/day: 0.50    Types: Cigarettes  . Smokeless tobacco: Never Used  . Alcohol use No     Allergies   Patient has no known allergies.   Review of Systems Review of Systems  Constitutional: Negative for fever.  Cardiovascular: Negative for chest pain.  Gastrointestinal: Negative for abdominal pain and bowel incontinence.  Genitourinary: Negative for bladder incontinence and dysuria.  Musculoskeletal: Positive for back pain.  Skin: Negative for wound.  Allergic/Immunologic: Negative for immunocompromised state.  Neurological: Negative for tingling, weakness and numbness.  Hematological: Does not bruise/bleed easily.     Physical Exam Updated Vital Signs BP 122/85 (BP Location: Right Arm)   Pulse 75   Temp 97.8 F (36.6 C) (Oral)   Resp 18   Ht 5' 9"  (1.753 m)   Wt 70.3 kg (155 lb)   SpO2 100%   BMI 22.89 kg/m   Physical Exam Physical Exam  Nursing note and vitals reviewed. Constitutional: Well developed, well nourished, non-toxic, and in no acute distress Head: Normocephalic and atraumatic.  Mouth/Throat: Oropharynx is clear and moist.  Neck: Normal range of motion. Neck supple.    Cardiovascular: Normal rate and regular rhythm.   Pulmonary/Chest: Effort normal and breath sounds normal.  Abdominal: Soft. There is no tenderness. There is no rebound and no guarding.  no CVA tenderness  Musculoskeletal: Normal range of motion.  tenderness to palpation of the low lumbar spine no deformities or step-offs.  Neurological: Alert, no facial droop, fluent speech, full strength in bilateral hip flexion/extension/abduction/adduction, knee flexion/extension, ankle dorsi/plantar flexion. Sensation to light touch intact throughout. Normal non-ataxic gait. Skin: Skin is warm and dry.  Psychiatric: Cooperative   ED Treatments / Results  Labs (all labs ordered are listed, but only abnormal results are displayed) Labs Reviewed - No data to display  EKG  EKG Interpretation None       Radiology No results found.  Procedures Procedures (including critical care time)  Medications Ordered in ED Medications  ibuprofen (ADVIL,MOTRIN) tablet 600 mg (not administered)  methocarbamol (ROBAXIN) tablet 500 mg (not administered)     Initial Impression / Assessment and Plan / ED Course  I have reviewed the triage vital signs and the nursing notes.  Pertinent labs & imaging results that were available during my care of the patient were reviewed by me and considered in my medical decision making (see chart for details).     41 year old male who presents with likely low back strain. No trauma. He has normal neurological exam. No concerning features of exam or history that would be concerning for emergent spine or spinal cord process at thsi time. Discussed supportive care management for now. Strict return and follow-up instructions reviewed. He expressed understanding of all discharge instructions and felt comfortable with the plan of care.   Final Clinical Impressions(s) / ED Diagnoses   Final diagnoses:  Strain of lumbar region, initial encounter    New Prescriptions New  Prescriptions   IBUPROFEN (ADVIL,MOTRIN) 600 MG TABLET    Take 1 tablet (600 mg total) by mouth every 6 (six) hours as needed for moderate pain.   METHOCARBAMOL (ROBAXIN) 500 MG TABLET    Take 1 tablet (500 mg total) by mouth every 8 (eight) hours as needed for muscle spasms.     Forde Dandy, MD 09/25/16 1352

## 2017-09-05 ENCOUNTER — Other Ambulatory Visit: Payer: Self-pay

## 2017-09-05 ENCOUNTER — Encounter (HOSPITAL_BASED_OUTPATIENT_CLINIC_OR_DEPARTMENT_OTHER): Payer: Self-pay

## 2017-09-05 ENCOUNTER — Emergency Department (HOSPITAL_BASED_OUTPATIENT_CLINIC_OR_DEPARTMENT_OTHER)
Admission: EM | Admit: 2017-09-05 | Discharge: 2017-09-05 | Disposition: A | Discharge status: Home / Self Care | Payer: Self-pay | Attending: Emergency Medicine | Admitting: Emergency Medicine

## 2017-09-05 ENCOUNTER — Emergency Department (HOSPITAL_BASED_OUTPATIENT_CLINIC_OR_DEPARTMENT_OTHER): Payer: Self-pay

## 2017-09-05 DIAGNOSIS — R070 Pain in throat: Secondary | ICD-10-CM | POA: Insufficient documentation

## 2017-09-05 DIAGNOSIS — F17210 Nicotine dependence, cigarettes, uncomplicated: Secondary | ICD-10-CM | POA: Insufficient documentation

## 2017-09-05 DIAGNOSIS — R0981 Nasal congestion: Secondary | ICD-10-CM | POA: Insufficient documentation

## 2017-09-05 DIAGNOSIS — J069 Acute upper respiratory infection, unspecified: Secondary | ICD-10-CM | POA: Insufficient documentation

## 2017-09-05 DIAGNOSIS — R509 Fever, unspecified: Secondary | ICD-10-CM | POA: Insufficient documentation

## 2017-09-05 DIAGNOSIS — B9789 Other viral agents as the cause of diseases classified elsewhere: Secondary | ICD-10-CM | POA: Insufficient documentation

## 2017-09-05 DIAGNOSIS — F1210 Cannabis abuse, uncomplicated: Secondary | ICD-10-CM | POA: Insufficient documentation

## 2017-09-05 LAB — COMPREHENSIVE METABOLIC PANEL
ALT: 24 U/L (ref 0–44)
ANION GAP: 10 (ref 5–15)
AST: 21 U/L (ref 15–41)
Albumin: 4.3 g/dL (ref 3.5–5.0)
Alkaline Phosphatase: 86 U/L (ref 38–126)
BUN: 11 mg/dL (ref 6–20)
CO2: 27 mmol/L (ref 22–32)
Calcium: 9.4 mg/dL (ref 8.9–10.3)
Chloride: 102 mmol/L (ref 98–111)
Creatinine, Ser: 0.77 mg/dL (ref 0.61–1.24)
Glucose, Bld: 98 mg/dL (ref 70–99)
POTASSIUM: 4 mmol/L (ref 3.5–5.1)
Sodium: 139 mmol/L (ref 135–145)
TOTAL PROTEIN: 8.2 g/dL — AB (ref 6.5–8.1)
Total Bilirubin: 1.4 mg/dL — ABNORMAL HIGH (ref 0.3–1.2)

## 2017-09-05 LAB — CBC WITH DIFFERENTIAL/PLATELET
Basophils Absolute: 0 10*3/uL (ref 0.0–0.1)
Basophils Relative: 0 %
Eosinophils Absolute: 0 10*3/uL (ref 0.0–0.7)
Eosinophils Relative: 0 %
HCT: 46.2 % (ref 39.0–52.0)
Hemoglobin: 16.5 g/dL (ref 13.0–17.0)
LYMPHS ABS: 2 10*3/uL (ref 0.7–4.0)
Lymphocytes Relative: 17 %
MCH: 29.9 pg (ref 26.0–34.0)
MCHC: 35.7 g/dL (ref 30.0–36.0)
MCV: 83.8 fL (ref 78.0–100.0)
MONOS PCT: 10 %
Monocytes Absolute: 1.2 10*3/uL — ABNORMAL HIGH (ref 0.1–1.0)
NEUTROS ABS: 8.4 10*3/uL — AB (ref 1.7–7.7)
Neutrophils Relative %: 73 %
PLATELETS: 188 10*3/uL (ref 150–400)
RBC: 5.51 MIL/uL (ref 4.22–5.81)
RDW: 13.6 % (ref 11.5–15.5)
WBC: 11.6 10*3/uL — ABNORMAL HIGH (ref 4.0–10.5)

## 2017-09-05 LAB — GROUP A STREP BY PCR: Group A Strep by PCR: NOT DETECTED

## 2017-09-05 NOTE — Discharge Instructions (Addendum)
Tylenol or Motrin for fever and pain.  Salt water gargles multiple times a day.  Drink plenty of fluids.  Follow-up with a family doctor if not improving in 2 3 days.  Blood work today is unremarkable, no signs of diabetes or clotting disorder, however if you continue to have easy bruising or any other concerning symptoms you will need further work-up with your family doctor

## 2017-09-05 NOTE — ED Provider Notes (Signed)
[facility] Provider Note   CSN: [id number] Arrival date & time: 09/05/17  1834     History   Chief Complaint Chief Complaint  Patient presents with  . Cough    HPI Devon Cantu is a 42 y.o. male.  HPI  Devon Cantu is a 42 y.o. male presents to emergency department complaining of sore throat, nasal congestion, cough for 3 days.  Patient has had subjective fever and chills.  He has not had any chest pain or shortness of breath.  He has not had any abdominal pain, nausea, vomiting, diarrhea.  No sick contacts.  Patient states that he would also like to be checked for diabetes and for clotting disorder.  States he bruises very easily.  He has not taken anything for his symptoms prior to coming in other than ibuprofen.  He states that ibuprofen does not help.  He denies any difficulty swallowing.  No change in voice.  Patient states it is painful to swallow.  He has no other complaints.   History reviewed. No pertinent past medical history.  There are no active problems to display for this patient.   Past Surgical History:  Procedure Laterality Date  .  plevis    . FRENULECTOMY, LINGUAL    . ORIF PELVIC FRACTURE          Home Medications    Prior to Admission medications   Medication Sig Start Date End Date Taking? Authorizing Provider  ibuprofen (ADVIL,MOTRIN) 200 MG tablet Take 200 mg by mouth every 6 (six) hours as needed.    [provider]  ibuprofen (ADVIL,MOTRIN) 600 MG tablet Take 1 tablet (600 mg total) by mouth every 6 (six) hours as needed for moderate pain. 09/25/16   Forde Dandy, MD  methocarbamol (ROBAXIN) 500 MG tablet Take 1 tablet (500 mg total) by mouth every 8 (eight) hours as needed for muscle spasms. 09/25/16   Forde Dandy, MD  oxyCODONE-acetaminophen (PERCOCET/ROXICET) 5-325 MG per tablet 1 to 2 tabs PO q6hrs  PRN for pain 08/17/13   Pisciotta, Elmyra Ricks, PA-C  predniSONE (DELTASONE) 20 MG tablet 3 tabs  po daily x 3 days, then 2 tabs x 3 days, then 1.5 tabs x 3 days, then 1 tab x 3 days, then 0.5 tabs x 3 days 08/17/13   Pisciotta, Elmyra Ricks, PA-C    Family History No family history on file.  Social History Social History   Tobacco Use  . Smoking status: Current Every Day Smoker    Packs/day: 0.50    Types: Cigarettes  . Smokeless tobacco: Never Used  Substance Use Topics  . Alcohol use: No  . Drug use: Yes    Types: Marijuana     Allergies   Patient has no known allergies.   Review of Systems Review of Systems  Constitutional: Positive for chills and fever.  HENT: Positive for congestion and sore throat. Negative for ear pain, trouble swallowing and voice change.   Respiratory: Positive for cough. Negative for chest tightness and shortness of breath.   Cardiovascular: Negative for chest pain, palpitations and leg swelling.  Gastrointestinal: Negative for abdominal distention, abdominal pain, diarrhea, nausea and vomiting.  Genitourinary: Negative for dysuria, frequency, hematuria and urgency.  Musculoskeletal: Positive for myalgias. Negative for arthralgias, neck pain and neck stiffness.  Skin: Negative for rash.  Allergic/Immunologic: Negative for immunocompromised state.  Neurological: Positive for headaches. Negative for dizziness, weakness, light-headedness and numbness.  Hematological: Bruises/bleeds easily.  All other  systems reviewed and are negative.    Physical Exam Updated Vital Signs BP (!) 153/102 (BP Location: Left Arm)   Pulse 98   Temp 99.2 F (37.3 C) (Oral)   Resp 18   Ht 5' 9"  (1.753 m)   Wt 70.3 kg   SpO2 98%   BMI 22.89 kg/m   Physical Exam  Constitutional: He appears well-developed and well-nourished. No distress.  HENT:  Head: Normocephalic and atraumatic.  Nasal congestion present.  Oropharynx is normal.  Uvula midline.  No tonsillar enlargement.  TMs are normal bilaterally  Eyes: Conjunctivae are normal.  Neck: Normal range of motion.  Neck supple.  Cardiovascular: Normal rate, regular rhythm and normal heart sounds.  Pulmonary/Chest: Effort normal. No respiratory distress. He has no wheezes. He has no rales.  Abdominal: Soft. Bowel sounds are normal. He exhibits no distension. There is no tenderness. There is no rebound.  Musculoskeletal: He exhibits no edema.  Lymphadenopathy:    He has no cervical adenopathy.  Neurological: He is alert.  Skin: Skin is warm and dry.  Bruising noted to the dorsal left hand  Nursing note and vitals reviewed.    ED Treatments / Results  Labs (all labs ordered are listed, but only abnormal results are displayed) Labs Reviewed  CBC WITH DIFFERENTIAL/PLATELET - Abnormal; Notable for the following components:      Result Value   WBC 11.6 (*)    Neutro Abs 8.4 (*)    Monocytes Absolute 1.2 (*)    All other components within normal limits  COMPREHENSIVE METABOLIC PANEL - Abnormal; Notable for the following components:   Total Protein 8.2 (*)    Total Bilirubin 1.4 (*)    All other components within normal limits  GROUP A STREP BY PCR    EKG None  Radiology No results found.  Procedures Procedures (including critical care time)  Medications Ordered in ED Medications - No data to display   Initial Impression / Assessment and Plan / ED Course  I have reviewed the triage vital signs and the nursing notes.  Pertinent labs & imaging results that were available during my care of the patient were reviewed by me and considered in my medical decision making (see chart for details).     Patient with unremarkable examination.  Here with URI symptoms and states would like to get checked for diabetes and for easily bruising.  Basic labs obtained showing slightly elevated white blood cell count of 11.6, otherwise unremarkable electrolytes with no concern of diabetes.  Normal platelet count.  Discussed these findings with patient.  Patient also had negative strep by PCR here and  negative chest x-ray.  His symptoms are due to a this likely viral upper respiratory tract infection.  His vital signs here are normal other than slight tachycardia, heart rate is 102.  He is afebrile, he is not hypoxic, he is not tachypneic.  We discussed symptomatic treatment of his URI including Tylenol, Motrin, oral hydration, rest.  Salt water gargles for sore throat.  We advised him to follow-up with his family doctor if not improving in 2 to 3 days.  Return precautions discussed.  Vitals:   09/05/17 1846 09/05/17 1847 09/05/17 2112  BP: (!) 153/102  (!) 139/92  Pulse: 98  (!) 102  Resp: 18  18  Temp: 99.2 F (37.3 C)    TempSrc: Oral    SpO2: 98%  100%  Weight:  70.3 kg   Height:  5' 9"  (1.753 m)  Final Clinical Impressions(s) / ED Diagnoses   Final diagnoses:  Viral URI with cough    ED Discharge Orders    None       Jeannett Senior, Hershal Coria 09/05/17 2139    Deno Etienne, DO 09/05/17 2142

## 2017-09-05 NOTE — ED Triage Notes (Signed)
C/o flu like sx day 3-NAD-steady gait

## 2017-09-07 ENCOUNTER — Encounter: Payer: Self-pay | Admitting: Adult Health

## 2017-09-07 ENCOUNTER — Ambulatory Visit (INDEPENDENT_AMBULATORY_CARE_PROVIDER_SITE_OTHER): Payer: Self-pay | Admitting: Adult Health

## 2017-09-07 VITALS — BP 120/84 | HR 89 | Temp 99.4°F | Wt 155.5 lb

## 2017-09-07 DIAGNOSIS — J029 Acute pharyngitis, unspecified: Secondary | ICD-10-CM

## 2017-09-07 NOTE — Progress Notes (Signed)
Subjective:    Patient ID: Devon Cantu, male    DOB: 01-Aug-1975, 42 y.o.   MRN: [id number]  HPI  42 year old male who presents to the office today for sore throat ( painful to swallow), left ear pain, headache, fever, chills.  Was seen in the emergency room two days ago with the same symptoms.  Was diagnosed with a viral UTI.   Denies any chest pain or shortness of breath.  He has not had any abdominal pain, nausea, vomiting, or diarrhea  Rapid Strep in the emergency room was negative.  [facility] labs showed an elevated white count of 11.6, otherwise unremarkable.  Chest x-ray was negative  Review of Systems   See HPI   No past medical history on file.  Social History   Socioeconomic History  . Marital status: Married    Spouse name: Not on file  . Number of children: Not on file  . Years of education: Not on file  . Highest education level: Not on file  Occupational History  . Not on file  Social Needs  . Financial resource strain: Not on file  . Food insecurity:    Worry: Not on file    Inability: Not on file  . Transportation needs:    Medical: Not on file    Non-medical: Not on file  Tobacco Use  . Smoking status: Current Every Day Smoker    Packs/day: 0.50    Types: Cigarettes  . Smokeless tobacco: Never Used  Substance and Sexual Activity  . Alcohol use: No  . Drug use: Yes    Types: Marijuana  . Sexual activity: Not on file  Lifestyle  . Physical activity:    Days per week: Not on file    Minutes per session: Not on file  . Stress: Not on file  Relationships  . Social connections:    Talks on phone: Not on file    Gets together: Not on file    Attends religious service: Not on file    Active member of club or organization: Not on file    Attends meetings of clubs or organizations: Not on file    Relationship status: Not on file  . Intimate partner violence:    Fear of current or ex partner: Not on file    Emotionally abused: Not on file   Physically abused: Not on file    Forced sexual activity: Not on file  Other Topics Concern  . Not on file  Social History Narrative  . Not on file    Past Surgical History:  Procedure Laterality Date  .  plevis    . FRENULECTOMY, LINGUAL    . ORIF PELVIC FRACTURE      No family history on file.  No Known Allergies  Current Outpatient Medications on File Prior to Visit  Medication Sig Dispense Refill  . ibuprofen (ADVIL,MOTRIN) 200 MG tablet Take 200 mg by mouth every 6 (six) hours as needed.     No current facility-administered medications on file prior to visit.     BP 120/84 (BP Location: Right Arm, Patient Position: Sitting, Cuff Size: Normal)   Pulse 89   Temp 99.4 F (37.4 C) (Oral)   Wt 155 lb 8 oz (70.5 kg)   SpO2 98%   BMI 22.96 kg/m       Objective:   Physical Exam  Constitutional: He is oriented to person, place, and time. He appears well-developed and well-nourished. No distress.  HENT:  Right Ear: Hearing and tympanic membrane normal.  Left Ear: Hearing, external ear and ear canal normal. A middle ear effusion is present.  Nose: Nose normal.  Mouth/Throat: Uvula is midline and mucous membranes are normal. Posterior oropharyngeal edema and posterior oropharyngeal erythema present. No oropharyngeal exudate or tonsillar abscesses. Tonsils are 2+ on the right. Tonsils are 2+ on the left. Tonsillar exudate.  Cardiovascular: Normal rate, regular rhythm, normal heart sounds and intact distal pulses. Exam reveals no gallop and no friction rub.  No murmur heard. Pulmonary/Chest: Effort normal and breath sounds normal. No stridor. No respiratory distress. He has no wheezes. He has no rales. He exhibits no tenderness.  Neurological: He is alert and oriented to person, place, and time.  Skin: Skin is warm and dry. He is not diaphoretic.  Psychiatric: He has a normal mood and affect. His behavior is normal. Judgment and thought content normal.  Nursing note and  vitals reviewed.     Assessment & Plan:  1. Sore throat -But strep in the office today was negative.  Due to symptoms and presentation will treat with Pen-Vee K 500 mg 3 times daily x days.  Per prescription was given due to computers being down when I saw this patient will send for strep culture.  Advised follow-up if no improvement in the next 2 or 3 days - Culture, Group A Strep - POC Rapid Strep A- Negative    Dorothyann Peng, NP

## 2017-09-08 LAB — POCT RAPID STREP A (OFFICE): Rapid Strep A Screen: NEGATIVE

## 2017-09-09 LAB — CULTURE, GROUP A STREP
MICRO NUMBER:: 91003434
SPECIMEN QUALITY:: ADEQUATE

## 2017-09-28 ENCOUNTER — Encounter: Payer: Self-pay | Admitting: Adult Health

## 2017-09-28 ENCOUNTER — Ambulatory Visit (INDEPENDENT_AMBULATORY_CARE_PROVIDER_SITE_OTHER): Payer: Self-pay | Admitting: Adult Health

## 2017-09-28 VITALS — BP 124/78 | Temp 98.4°F | Wt 160.0 lb

## 2017-09-28 DIAGNOSIS — K625 Hemorrhage of anus and rectum: Secondary | ICD-10-CM

## 2017-09-28 DIAGNOSIS — Z0000 Encounter for general adult medical examination without abnormal findings: Secondary | ICD-10-CM

## 2017-09-28 DIAGNOSIS — Z720 Tobacco use: Secondary | ICD-10-CM

## 2017-09-28 LAB — CBC WITH DIFFERENTIAL/PLATELET
Basophils Absolute: 0.1 10*3/uL (ref 0.0–0.1)
Basophils Relative: 0.8 % (ref 0.0–3.0)
EOS ABS: 0.2 10*3/uL (ref 0.0–0.7)
Eosinophils Relative: 3.2 % (ref 0.0–5.0)
HCT: 44.4 % (ref 39.0–52.0)
Hemoglobin: 15.3 g/dL (ref 13.0–17.0)
Lymphocytes Relative: 34 % (ref 12.0–46.0)
Lymphs Abs: 2.5 10*3/uL (ref 0.7–4.0)
MCHC: 34.5 g/dL (ref 30.0–36.0)
MCV: 86.6 fl (ref 78.0–100.0)
MONO ABS: 0.5 10*3/uL (ref 0.1–1.0)
Monocytes Relative: 7.6 % (ref 3.0–12.0)
Neutro Abs: 3.9 10*3/uL (ref 1.4–7.7)
Neutrophils Relative %: 54.4 % (ref 43.0–77.0)
Platelets: 229 10*3/uL (ref 150.0–400.0)
RBC: 5.13 Mil/uL (ref 4.22–5.81)
RDW: 13.9 % (ref 11.5–15.5)
WBC: 7.2 10*3/uL (ref 4.0–10.5)

## 2017-09-28 LAB — BASIC METABOLIC PANEL
BUN: 15 mg/dL (ref 6–23)
CO2: 29 mEq/L (ref 19–32)
Calcium: 9.6 mg/dL (ref 8.4–10.5)
Chloride: 102 mEq/L (ref 96–112)
Creatinine, Ser: 0.78 mg/dL (ref 0.40–1.50)
GFR: 115.05 mL/min (ref >60.00)
Glucose, Bld: 72 mg/dL (ref 70–99)
Potassium: 3.9 mEq/L (ref 3.5–5.1)
SODIUM: 138 meq/L (ref 135–145)

## 2017-09-28 LAB — LIPID PANEL
Cholesterol: 223 mg/dL — ABNORMAL HIGH (ref 0–200)
HDL: 50.9 mg/dL (ref >39.00)
LDL Cholesterol: 154 mg/dL — ABNORMAL HIGH (ref 0–99)
NonHDL: 172.06
TRIGLYCERIDES: 92 mg/dL (ref 0.0–149.0)
Total CHOL/HDL Ratio: 4
VLDL: 18.4 mg/dL (ref 0.0–40.0)

## 2017-09-28 LAB — HEPATIC FUNCTION PANEL
ALK PHOS: 77 U/L (ref 39–117)
ALT: 28 U/L (ref 0–53)
AST: 19 U/L (ref 0–37)
Albumin: 4.4 g/dL (ref 3.5–5.2)
BILIRUBIN DIRECT: 0.2 mg/dL (ref 0.0–0.3)
BILIRUBIN TOTAL: 1.2 mg/dL (ref 0.2–1.2)
Total Protein: 7.3 g/dL (ref 6.0–8.3)

## 2017-09-28 LAB — HEMOGLOBIN A1C: HEMOGLOBIN A1C: 5.7 % (ref 4.6–6.5)

## 2017-09-28 LAB — TSH: TSH: 1.47 u[IU]/mL (ref 0.35–4.50)

## 2017-09-28 NOTE — Progress Notes (Signed)
Patient presents to clinic today to establish care. He is a pleasant 42 year old male who  has a past medical history of Splenic laceration.   Acute Concerns: Establish Care/CPE   Chronic Issues: Tobacco Use - Smokes about a 0.5 -1 pack a day. He has tried cold [place]. Does not want to quit at this time.   Rectal Bleeding - has been an ongoing issue over the last "7-8 years". Intermittent, may go weeks between bloody bowel movements. Reports bright red blood. No clots. Sometimes lasts longer than one bowel movement. Denies any rectal itching or pain. Unsure if he has hemorrhoids. Denies dark tarry stools, abdominal pain/cramping, nausea, vomiting, or diarrhea.   Health Maintenance: Dental -- Does not do routine care Vision -- Does not do routine care  Immunizations -- 2005.  Colonoscopy --Never had    Past Medical History:  Diagnosis Date  . Splenic laceration     Past Surgical History:  Procedure Laterality Date  .  plevis    . FRENULECTOMY, LINGUAL    . ORIF PELVIC FRACTURE      Current Outpatient Medications on File Prior to Visit  Medication Sig Dispense Refill  . ibuprofen (ADVIL,MOTRIN) 200 MG tablet Take 200 mg by mouth every 6 (six) hours as needed.     No current facility-administered medications on file prior to visit.     No Known Allergies  Family History  Problem Relation Age of Onset  . Breast cancer Maternal Grandmother   . Lung cancer Maternal Grandmother   . Diabetes Maternal Grandfather   . Cancer Paternal Grandmother   . Aneurysm Brother     Social History   Socioeconomic History  . Marital status: Married    Spouse name: Not on file  . Number of children: Not on file  . Years of education: Not on file  . Highest education level: Not on file  Occupational History  . Not on file  Social Needs  . Financial resource strain: Not on file  . Food insecurity:    Worry: Not on file    Inability: Not on file  . Transportation needs:   Medical: Not on file    Non-medical: Not on file  Tobacco Use  . Smoking status: Current Every Day Smoker    Packs/day: 0.50    Types: Cigarettes  . Smokeless tobacco: Never Used  Substance and Sexual Activity  . Alcohol use: No  . Drug use: Yes    Types: Marijuana  . Sexual activity: Not on file  Lifestyle  . Physical activity:    Days per week: Not on file    Minutes per session: Not on file  . Stress: Not on file  Relationships  . Social connections:    Talks on phone: Not on file    Gets together: Not on file    Attends religious service: Not on file    Active member of club or organization: Not on file    Attends meetings of clubs or organizations: Not on file    Relationship status: Not on file  . Intimate partner violence:    Fear of current or ex partner: Not on file    Emotionally abused: Not on file    Physically abused: Not on file    Forced sexual activity: Not on file  Other Topics Concern  . Not on file  Social History Narrative  . Not on file    Review of Systems  Constitutional: Negative.  HENT: Negative.   Eyes: Negative.   Respiratory: Negative.   Cardiovascular: Negative.   Gastrointestinal: Positive for blood in stool. Negative for melena.  Genitourinary: Negative.   Musculoskeletal: Negative.   Skin: Negative.   Neurological: Negative.   Endo/Heme/Allergies: Negative.   Psychiatric/Behavioral: Negative.   All other systems reviewed and are negative.   BP 124/78   Temp 98.4 F (36.9 C) (Oral)   Wt 160 lb (72.6 kg)   BMI 23.63 kg/m   Physical Exam  Constitutional: He is oriented to person, place, and time. He appears well-developed and well-nourished. No distress.  HENT:  Head: Normocephalic and atraumatic.  Right Ear: External ear normal.  Left Ear: External ear normal.  Nose: Nose normal.  Mouth/Throat: Oropharynx is clear and moist. No oropharyngeal exudate.  Eyes: Pupils are equal, round, and reactive to light. Conjunctivae  and EOM are normal. Right eye exhibits no discharge. Left eye exhibits no discharge. No scleral icterus.  Neck: Normal range of motion. Neck supple. No JVD present. No tracheal deviation present. No thyromegaly present.  Cardiovascular: Normal rate, regular rhythm, normal heart sounds and intact distal pulses. Exam reveals no gallop and no friction rub.  No murmur heard. Pulmonary/Chest: Effort normal and breath sounds normal. No stridor. No respiratory distress. He has no wheezes. He has no rales. He exhibits no tenderness.  Abdominal: Soft. Bowel sounds are normal.  Genitourinary: Prostate normal. Rectal exam shows internal hemorrhoid (?) and guaiac positive stool. Rectal exam shows no external hemorrhoid, no fissure and no tenderness.  Genitourinary Comments: Bloody stool on glove    Musculoskeletal: Normal range of motion. He exhibits no edema, tenderness or deformity.  Lymphadenopathy:    He has no cervical adenopathy.  Neurological: He is alert and oriented to person, place, and time. He displays normal reflexes. No cranial nerve deficit or sensory deficit. He exhibits normal muscle tone. Coordination normal.  Skin: Skin is warm and dry. Capillary refill takes less than 2 seconds. He is not diaphoretic.  Psychiatric: He has a normal mood and affect. His behavior is normal. Judgment and thought content normal.  Nursing note and vitals reviewed.   Assessment/Plan: 1. Routine general medical examination at a health care facility - Follow up in one year for next CPE  - Follow up sooner if needed - Basic metabolic panel - CBC with Differential/Platelet - Hemoglobin A1c - Hepatic function panel - Lipid panel - TSH  2. Rectal bleeding  - Ambulatory referral to Gastroenterology  3. Tobacco use - Encouraged to quit smoking  - He can follow up when he is ready and we will help him quit   Dorothyann Peng, NP

## 2017-09-29 ENCOUNTER — Other Ambulatory Visit: Payer: Self-pay | Admitting: Family Medicine

## 2017-09-29 ENCOUNTER — Encounter: Payer: Self-pay | Admitting: Gastroenterology

## 2017-09-29 ENCOUNTER — Telehealth: Payer: Self-pay | Admitting: Adult Health

## 2017-09-29 MED ORDER — SIMVASTATIN 5 MG PO TABS: 5.0000 mg | ORAL_TABLET | Freq: Every day | ORAL | 3 refills | Status: DC

## 2017-09-29 NOTE — Telephone Encounter (Signed)
Patient called and asked what are his cholesterol numbers, results given to patient.

## 2017-10-09 ENCOUNTER — Ambulatory Visit (INDEPENDENT_AMBULATORY_CARE_PROVIDER_SITE_OTHER): Payer: Self-pay | Admitting: Gastroenterology

## 2017-10-09 ENCOUNTER — Encounter: Payer: Self-pay | Admitting: Gastroenterology

## 2017-10-09 VITALS — BP 124/80 | HR 86 | Ht 69.0 in | Wt 162.0 lb

## 2017-10-09 DIAGNOSIS — F17200 Nicotine dependence, unspecified, uncomplicated: Secondary | ICD-10-CM

## 2017-10-09 DIAGNOSIS — K921 Melena: Secondary | ICD-10-CM

## 2017-10-09 MED ORDER — SOD PICOSULFATE-MAG OX-CIT ACD 10-3.5-12 MG-GM -GM/160ML PO SOLN: 1.0000 | ORAL | 0 refills | Status: DC

## 2017-10-09 NOTE — Patient Instructions (Signed)
If you are age 44 or older, your body mass index should be between 23-30. Your Body mass index is 23.92 kg/m. If this is out of the aforementioned range listed, please consider follow up with your Primary Care Provider.  If you are age 62 or younger, your body mass index should be between 19-25. Your Body mass index is 23.92 kg/m. If this is out of the aformentioned range listed, please consider follow up with your Primary Care Provider.   You have been scheduled for a colonoscopy. Please follow written instructions given to you at your visit today.  Please pick up your prep supplies at the pharmacy within the next 1-3 days. If you use inhalers (even only as needed), please bring them with you on the day of your procedure. Your physician has requested that you go to www.startemmi.com and enter the access code given to you at your visit today. This web site gives a general overview about your procedure. However, you should still follow specific instructions given to you by our office regarding your preparation for the procedure.  It was a pleasure to see you today!  Vito Cirigliano, D.O.

## 2017-10-09 NOTE — Progress Notes (Signed)
Chief Complaint: Hematochezia   Referring Provider:     Dorothyann Peng, NP    HPI:     Devon Cantu is a 42 y.o. male referred to the Gastroenterology Clinic for evaluation of hematochezia.  He states he has been having intermittent, small-volume hematochezia for the last "5+ years ". More recently increased in frequency, sxs occurring once every 2 weeks, along with increased volume, now filling toilet water with BRB. No abdominal pain but occasionally a/w dyschezia. Was previously told was 2/2 hemorrhoids but no previous tx. Does have assocaited headaches with increased bleeding episodes but no CP, SOB, DOE, syncope. No prior colonoscopy.   Recent evaluation notable for normal LAEs, CBC, and BMP.  No recent abdominal imaging for review.  FHx n/f grandfather with EtOH cirrhosis and ESRD, but no known family history of GI malignancy, IBD, CRC.    Past Medical History:  Diagnosis Date  . Splenic laceration      Past Surgical History:  Procedure Laterality Date  .  plevis    . FRENULECTOMY, LINGUAL    . ORIF PELVIC FRACTURE     Family History  Problem Relation Age of Onset  . High blood pressure Mother   . Breast cancer Maternal Grandmother   . Lung cancer Maternal Grandmother   . Diabetes Maternal Grandfather   . Alcohol abuse Maternal Grandfather   . High blood pressure Maternal Grandfather   . Cancer Paternal Grandmother   . Aneurysm Brother    Social History   Tobacco Use  . Smoking status: Current Every Day Smoker    Packs/day: 0.50    Types: Cigarettes  . Smokeless tobacco: Never Used  Substance Use Topics  . Alcohol use: No  . Drug use: Yes    Types: Marijuana   Current Outpatient Medications  Medication Sig Dispense Refill  . ibuprofen (ADVIL,MOTRIN) 200 MG tablet Take 200 mg by mouth every 6 (six) hours as needed.    . simvastatin (ZOCOR) 5 MG tablet Take 1 tablet (5 mg total) by mouth at bedtime. 90 tablet 3   No current  facility-administered medications for this visit.    No Known Allergies   Review of Systems: All systems reviewed and negative except where noted in HPI.     Physical Exam:    Wt Readings from Last 3 Encounters:  09/28/17 160 lb (72.6 kg)  09/07/17 155 lb 8 oz (70.5 kg)  09/05/17 155 lb (70.3 kg)    There were no vitals taken for this visit. Constitutional:  Pleasant, in no acute distress. Psychiatric: Normal mood and affect. Behavior is normal. EENT: Pupils normal.  Conjunctivae are normal. No scleral icterus. Neck supple. No cervical LAD. Cardiovascular: Normal rate, regular rhythm. No edema Pulmonary/chest: Effort normal and breath sounds normal. No wheezing, rales or rhonchi. Abdominal: Soft, nondistended, nontender. Bowel sounds active throughout. There are no masses palpable. No hepatomegaly. Neurological: Alert and oriented to person place and time. Skin: Skin is warm and dry. No rashes noted.   ASSESSMENT AND PLAN;   Devon Cantu is a 42 y.o. male presenting with:  1) Hematochezia: Clinical presentation seems most consistent with benign anal rectal disorder (i.e. anal fissure, hemorrhoids).  However given duration of symptoms, patient age, recent increases in frequency and blood volume, we will plan on evaluating for additional proximal mucosal/luminal etiology with colonoscopy. - Plan for colonoscopy  The indications, risks, and benefits of colonoscopy were  explained to the patient in detail. Risks include but are not limited to bleeding, perforation, adverse reaction to medications, and cardiopulmonary compromise. Sequelae include but are not limited to the possibility of surgery, hospitalization, and mortality. The patient verbalized understanding and wished to proceed. All questions answered, referred to the scheduler and bowel prep ordered. Further recommendations pending results of the exam.   2) tobacco user disorder: Counseled patient on smoking  cessation.  Has been smoking 1 pack/day since age 33.  Discussed overall health risks as well as GI specific risks of continued smoking.  Aware of risks and cessation programs.   Lavena Bullion, DO, FACG  10/09/2017, 8:41 AM   Dorothyann Peng, NP

## 2017-10-12 ENCOUNTER — Encounter: Payer: Self-pay | Admitting: Gastroenterology

## 2017-10-12 ENCOUNTER — Ambulatory Visit (AMBULATORY_SURGERY_CENTER): Payer: Self-pay | Admitting: Gastroenterology

## 2017-10-12 VITALS — BP 130/76 | HR 58 | Temp 98.6°F | Resp 16 | Ht 69.0 in | Wt 162.0 lb

## 2017-10-12 DIAGNOSIS — K640 First degree hemorrhoids: Secondary | ICD-10-CM

## 2017-10-12 DIAGNOSIS — D128 Benign neoplasm of rectum: Secondary | ICD-10-CM

## 2017-10-12 DIAGNOSIS — K5000 Crohn's disease of small intestine without complications: Secondary | ICD-10-CM

## 2017-10-12 DIAGNOSIS — K621 Rectal polyp: Secondary | ICD-10-CM

## 2017-10-12 DIAGNOSIS — K921 Melena: Secondary | ICD-10-CM

## 2017-10-12 MED ORDER — SODIUM CHLORIDE 0.9 % IV SOLN: 500.0000 mL | Freq: Once | INTRAVENOUS | Status: DC

## 2017-10-12 NOTE — Patient Instructions (Signed)
Handouts:  Polyps  YOU HAD AN ENDOSCOPIC PROCEDURE TODAY AT [facility]:   Refer to the procedure report that was given to you for any specific questions about what was found during the examination.  If the procedure report does not answer your questions, please call your gastroenterologist to clarify.  If you requested that your care partner not be given the details of your procedure findings, then the procedure report has been included in a sealed envelope for you to review at your convenience later.  YOU SHOULD EXPECT: Some feelings of bloating in the abdomen. Passage of more gas than usual.  Walking can help get rid of the air that was put into your GI tract during the procedure and reduce the bloating. If you had a lower endoscopy (such as a colonoscopy or flexible sigmoidoscopy) you may notice spotting of blood in your stool or on the toilet paper. If you underwent a bowel prep for your procedure, you may not have a normal bowel movement for a few days.  Please Note:  You might notice some irritation and congestion in your nose or some drainage.  This is from the oxygen used during your procedure.  There is no need for concern and it should clear up in a day or so.  SYMPTOMS TO REPORT IMMEDIATELY:   Following lower endoscopy (colonoscopy or flexible sigmoidoscopy):  Excessive amounts of blood in the stool  Significant tenderness or worsening of abdominal pains  Swelling of the abdomen that is new, acute  Fever of 100F or higher  For urgent or emergent issues, a gastroenterologist can be reached at any hour by calling [phone].   DIET:  We do recommend a small meal at first, but then you may proceed to your regular diet.  Drink plenty of fluids but you should avoid alcoholic beverages for 24 hours.  ACTIVITY:  You should plan to take it easy for the rest of today and you should NOT DRIVE or use heavy machinery until tomorrow (because of the sedation medicines used  during the test).    FOLLOW UP: Our staff will call the number listed on your records the next business day following your procedure to check on you and address any questions or concerns that you may have regarding the information given to you following your procedure. If we do not reach you, we will leave a message.  However, if you are feeling well and you are not experiencing any problems, there is no need to return our call.  We will assume that you have returned to your regular daily activities without incident.  If any biopsies were taken you will be contacted by phone or by letter within the next 1-3 weeks.  Please call us at [phone] if you have not heard about the biopsies in 3 weeks.    SIGNATURES/CONFIDENTIALITY: You and/or your care partner have signed paperwork which will be entered into your electronic medical record.  These signatures attest to the fact that that the information above on your After Visit Summary has been reviewed and is understood.  Full responsibility of the confidentiality of this discharge information lies with you and/or your care-partner.

## 2017-10-12 NOTE — Op Note (Signed)
[place] Patient Name: Devon Cantu Procedure Date: 10/12/2017 9:31 AM MRN: [id number] Endoscopist: Gerrit Heck , MD Age: 42 Referring MD:  Date of Birth: Jul 09, 1975 Gender: Male Account #: [id number] Procedure:                Colonoscopy Indications:              Hematochezia Medicines:                Monitored Anesthesia Care Procedure:                Pre-Anesthesia Assessment:                           - Prior to the procedure, a History and Physical                            was performed, and patient medications and                            allergies were reviewed. The patient's tolerance of                            previous anesthesia was also reviewed. The risks                            and benefits of the procedure and the sedation                            options and risks were discussed with the patient.                            All questions were answered, and informed consent                            was obtained. Prior Anticoagulants: The patient has                            taken no previous anticoagulant or antiplatelet                            agents. ASA Grade Assessment: II - A patient with                            mild systemic disease. After reviewing the risks                            and benefits, the patient was deemed in                            satisfactory condition to undergo the procedure.                           After obtaining informed consent, the colonoscope  was passed under direct vision. Throughout the                            procedure, the patient's blood pressure, pulse, and                            oxygen saturations were monitored continuously. The                            Colonoscope was introduced through the anus and                            advanced to the the terminal ileum. The colonoscopy                            was performed without difficulty. The  patient                            tolerated the procedure well. The quality of the                            bowel preparation was adequate. Scope In: 9:40:08 AM Scope Out: 9:51:35 AM Scope Withdrawal Time: 0 hours 10 minutes 12 seconds  Total Procedure Duration: 0 hours 11 minutes 27 seconds  Findings:                 The perianal and digital rectal examinations were                            normal.                           A 2 mm polyp was found in the rectum. The polyp was                            sessile. The polyp was removed with a cold biopsy                            forceps. Resection and retrieval were complete.                            Estimated blood loss was minimal.                           Non-bleeding internal hemorrhoids were found during                            retroflexion. The hemorrhoids were medium-sized.                           The exam was otherwise normal throughout the                            remainder of the colon.  The terminal ileum and ileocecal valve contained a                            few localized non-bleeding erosions with mild                            surrounding mucosal erythemia. No stigmata of                            recent bleeding were seen. This was limited to the                            last 3 cm of the ileum. Advanced the colonoscope to                            10 cm from the ileocecal valve, which was otherwise                            normal appearing. Biopsies were taken with a cold                            forceps for histology. Estimated blood loss was                            minimal. Complications:            No immediate complications. Estimated Blood Loss:     Estimated blood loss was minimal. Impression:               - One 2 mm polyp in the rectum, removed with a cold                            biopsy forceps. Resected and retrieved.                           -  Non-bleeding internal hemorrhoids.                           - A few erosions in the terminal ileum and at the                            ileocecal valve. Biopsied. Recommendation:           - Patient has a contact number available for                            emergencies. The signs and symptoms of potential                            delayed complications were discussed with the                            patient. Return to normal activities tomorrow.  Written discharge instructions were provided to the                            patient.                           - Resume previous diet today.                           - No aspirin, ibuprofen, naproxen, or other                            non-steroidal anti-inflammatory drugs.                           - Await pathology results.                           - Repeat colonoscopy in 5-10 years for surveillance                            based on pathology results.                           - Internal hemorrhoids were noted on this study and                            may be amenable to hemorrhoid band ligation. If you                            are interested in further treatment of these                            hemorrhoids with band ligation, please contact my                            clinic to set up an appointment for evaluation and                            treatment. Gerrit Heck, MD 10/12/2017 9:59:50 AM

## 2017-10-12 NOTE — Progress Notes (Signed)
Called to room to assist during endoscopic procedure.  Patient ID and intended procedure confirmed with present staff. Received instructions for my participation in the procedure from the performing physician.  

## 2017-10-13 ENCOUNTER — Telehealth: Payer: Self-pay | Admitting: *Deleted

## 2017-10-13 NOTE — Telephone Encounter (Signed)
  Follow up Call-  Call back number 10/12/2017  Post procedure Call Back phone  # [phone]  Permission to leave phone message Yes  Some recent data might be hidden     Patient questions:  Do you have a fever, pain , or abdominal swelling? No. Pain Score  0 *  Have you tolerated food without any problems? Yes.    Have you been able to return to your normal activities? Yes.    Do you have any questions about your discharge instructions: Diet   No. Medications  No. Follow up visit  No.  Do you have questions or concerns about your Care? No.  Actions: * If pain score is 4 or above: No action needed, pain <4.

## 2017-10-18 ENCOUNTER — Encounter: Payer: Self-pay | Admitting: Gastroenterology

## 2018-05-01 ENCOUNTER — Other Ambulatory Visit: Payer: Self-pay

## 2018-05-01 ENCOUNTER — Ambulatory Visit (INDEPENDENT_AMBULATORY_CARE_PROVIDER_SITE_OTHER): Payer: Self-pay | Admitting: Adult Health

## 2018-05-01 ENCOUNTER — Encounter: Payer: Self-pay | Admitting: Adult Health

## 2018-05-01 DIAGNOSIS — J020 Streptococcal pharyngitis: Secondary | ICD-10-CM

## 2018-05-01 MED ORDER — PENICILLIN V POTASSIUM 500 MG PO TABS: 500.0000 mg | ORAL_TABLET | Freq: Three times a day (TID) | ORAL | 0 refills | Status: AC

## 2018-05-01 NOTE — Progress Notes (Signed)
Virtual Visit via Video Note  I connected with Devon Cantu on 05/01/18 at  2:30 PM EDT by a video enabled telemedicine application and verified that I am speaking with the correct person using two identifiers.  Location patient: home Location provider:work or home office Persons participating in the virtual visit: patient, provider  I discussed the limitations of evaluation and management by telemedicine and the availability of in person appointments. The patient expressed understanding and agreed to proceed.   HPI: 43 year old male who is being evaluated today for sore throat.  Ports symptoms started 2 to 3 days ago.  Symptoms include sore throat that is especially apparent when he swallows "feels like razor blades when I swallow", body aches, and a fever that started this morning up to 101.  Pain is mostly located on the right side of his throat.  He has not looked to see if there is any white spots on the back of his throat.  He does report some intermittent right-sided ear pain.  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  He does have a history of strep throat and feels as though this is similar in nature to the last time he had it.  Report taking over-the-counter Motrin this morning and reports that since taking this the discomfort in his throat and fever have improved   ROS: See pertinent positives and negatives per HPI.  Past Medical History:  Diagnosis Date  . Elevated cholesterol   . Splenic laceration     Past Surgical History:  Procedure Laterality Date  .  plevis    . FRENULECTOMY, LINGUAL    . ORIF PELVIC FRACTURE  2007    Family History  Problem Relation Age of Onset  . High blood pressure Mother   . Breast cancer Maternal Grandmother   . Lung cancer Maternal Grandmother   . Diabetes Maternal Grandfather   . Alcohol abuse Maternal Grandfather   . High blood pressure Maternal Grandfather    . Kidney disease Maternal Grandfather   . Cancer Paternal Grandmother        doesn't know which kind  . Aneurysm Brother   . Esophageal cancer Maternal Uncle   . Colon cancer Neg Hx   . Rectal cancer Neg Hx   . Stomach cancer Neg Hx       Current Outpatient Medications:  .  penicillin v potassium (VEETID) 500 MG tablet, Take 1 tablet (500 mg total) by mouth 3 (three) times daily for 10 days., Disp: 30 tablet, Rfl: 0 .  simvastatin (ZOCOR) 5 MG tablet, Take 1 tablet (5 mg total) by mouth at bedtime., Disp: 90 tablet, Rfl: 3  EXAM:  VITALS per patient if applicable:  GENERAL: alert, oriented, appears well and in no acute distress  HEENT: atraumatic, conjunttiva clear, no obvious abnormalities on inspection of external nose and ears  NECK: normal movements of the head and neck  LUNGS: on inspection no signs of respiratory distress, breathing rate appears normal, no obvious gross SOB, gasping or wheezing  CV: no obvious cyanosis  MS: moves all visible extremities without noticeable abnormality  PSYCH/NEURO: pleasant and cooperative, no obvious depression or anxiety, speech and thought processing grossly intact  ASSESSMENT AND PLAN:  Discussed the following assessment and plan:  Strep throat - Plan: penicillin v potassium (VEETID) 500 MG tablet  -Treat as strep throat due to symptoms and history.  He was advised to  continue use Motrin as needed, warm salt water gargles, and to follow-up if no improvement in 2 to 3 days or if symptoms worsen   I discussed the assessment and treatment plan with the patient. The patient was provided an opportunity to ask questions and all were answered. The patient agreed with the plan and demonstrated an understanding of the instructions.   The patient was advised to call back or seek an in-person evaluation if the symptoms worsen or if the condition fails to improve as anticipated.   Dorothyann Peng, NP

## 2020-02-12 ENCOUNTER — Other Ambulatory Visit: Payer: Self-pay

## 2020-02-13 ENCOUNTER — Ambulatory Visit (INDEPENDENT_AMBULATORY_CARE_PROVIDER_SITE_OTHER): Payer: Self-pay | Admitting: Adult Health

## 2020-02-13 ENCOUNTER — Encounter: Payer: Self-pay | Admitting: Adult Health

## 2020-02-13 VITALS — BP 140/90 | Temp 97.7°F | Wt 173.0 lb

## 2020-02-13 DIAGNOSIS — T148XXA Other injury of unspecified body region, initial encounter: Secondary | ICD-10-CM

## 2020-02-13 MED ORDER — METHYLPREDNISOLONE 4 MG PO TBPK: ORAL_TABLET | ORAL | 0 refills | Status: DC

## 2020-02-13 MED ORDER — CYCLOBENZAPRINE HCL 10 MG PO TABS: 10.0000 mg | ORAL_TABLET | Freq: Three times a day (TID) | ORAL | 0 refills | Status: DC | PRN

## 2020-02-13 NOTE — Progress Notes (Signed)
Subjective:    Patient ID: Devon Cantu, male    DOB: November 10, 1975, 45 y.o.   MRN: [id number]  HPI 45 year old male who  has a past medical history of Elevated cholesterol and Splenic laceration.  He presents to the office today for an acute issue. His symptoms started about 3 weeks ago. Reports a stabbing pain that is constant in nature.  Pain is mostly located in his right lower neck and radiates to the right upper back along the shoulder blade.  Denies trauma or aggravating injury.  Has not been using anything over-the-counter but does do stretching exercises to help alleviate his pain.  Does not have any loss of range of motion of the right shoulder, or numbness and tingling that radiates down the right leg  Review of Systems See HPI   Past Medical History:  Diagnosis Date  . Elevated cholesterol   . Splenic laceration     Social History   Socioeconomic History  . Marital status: Married    Spouse name: Not on file  . Number of children: 3  . Years of education: Not on file  . Highest education level: Not on file  Occupational History  . Occupation: Roofer  Tobacco Use  . Smoking status: Current Every Day Smoker    Packs/day: 0.50    Types: Cigarettes  . Smokeless tobacco: Never Used  Vaping Use  . Vaping Use: Never used  Substance and Sexual Activity  . Alcohol use: No  . Drug use: Yes    Types: Marijuana  . Sexual activity: Not on file  Other Topics Concern  . 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  plevis    . FRENULECTOMY, LINGUAL    . ORIF PELVIC FRACTURE  2007    Family  History  Problem Relation Age of Onset  . High blood pressure Mother   . Breast cancer Maternal Grandmother   . Lung cancer Maternal Grandmother   . Diabetes Maternal Grandfather   . Alcohol abuse Maternal Grandfather   . High blood pressure Maternal Grandfather   . Kidney disease Maternal Grandfather   . Cancer Paternal Grandmother        doesn't know which kind  . Aneurysm Brother   . Esophageal cancer Maternal Uncle   . Colon cancer Neg Hx   . Rectal cancer Neg Hx   . Stomach cancer Neg Hx     No Known Allergies  No current outpatient medications on file prior to visit.   No current facility-administered medications on file prior to visit.    BP 140/90   Temp 97.7 F (36.5 C)   Wt 173 lb (78.5 kg)   BMI 25.55 kg/m       Objective:   Physical Exam Vitals and nursing note reviewed.  Constitutional:      Appearance: Normal appearance.  Cardiovascular:     Rate and Rhythm: Normal rate and regular rhythm.     Pulses: Normal pulses.     Heart sounds: Normal heart sounds.  Pulmonary:  Effort: Pulmonary effort is normal.     Breath sounds: Normal breath sounds.  Musculoskeletal:        General: Normal range of motion.     Left shoulder: Tenderness present. No swelling, deformity, bony tenderness or crepitus. Normal range of motion. Normal strength.     Comments: Does have tenderness and tightness with palpation throughout right trapezius.  No spinal tenderness noted  Skin:    General: Skin is warm and dry.     Capillary Refill: Capillary refill takes less than 2 seconds.  Neurological:     General: No focal deficit present.     Mental Status: He is alert and oriented to person, place, and time.  Psychiatric:        Mood and Affect: Mood normal.        Behavior: Behavior normal.        Thought Content: Thought content normal.        Judgment: Judgment normal.       Assessment & Plan:  1. Muscle strain - methylPREDNISolone (MEDROL DOSEPAK) 4 MG TBPK  tablet; Take as directed  Dispense: 21 tablet; Refill: 0 - cyclobenzaprine (FLEXERIL) 10 MG tablet; Take 1 tablet (10 mg total) by mouth 3 (three) times daily as needed for muscle spasms.  Dispense: 30 tablet; Refill: 0 - Warm compress and stretching exercises   Dorothyann Peng, NP

## 2020-04-23 NOTE — Progress Notes (Signed)
Subjective:    Patient ID: Devon Cantu, male    DOB: 01-17-1976, 45 y.o.   MRN: [id number]  HPI Patient presents for yearly preventative medicine examination. He is a pleasant 45 year old male who  has a past medical history of Elevated cholesterol and Splenic laceration.  His last CPE was in 2019   Hyperlipidemia - not on any medication  Lab Results  Component Value Date   CHOL 223 (H) 09/28/2017   HDL 50.90 09/28/2017   LDLCALC 154 (H) 09/28/2017   TRIG 92.0 09/28/2017   CHOLHDL 4 09/28/2017   Tobacco Use - Smokes about a pack a day. Does not want to quit.   Right shoulder pain -he was originally seen for this in January 2022.  At that time his pain present for 3 weeks.  Pain was described as a stabbing pain that is constant in nature.  Is mostly located in his right lower neck and radiated to the right upper back along the shoulder blade.  He denies trauma or aggravating injuries.  He was prescribed a Medrol Dosepak and Flexeril at this time he thought it was more muscular.  Today he reports that he continues to have pain in his right shoulder, some days is worse than others but it is pretty constant in nature.  Denies any loss of range of motion or grip strength.  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  Appropriate screening laboratory values were ordered for the patient including screening of hyperlipidemia, renal function and hepatic function. If indicated by BPH, a PSA was ordered.  Medication reconciliation,  past medical history, social history, problem list and allergies were reviewed in detail with the patient  Goals were established with regard to weight loss, exercise, and  diet in compliance with medications  He had a colonoscopy in 2019 which he had one 2 mm polyp removed - recommended 10 year  follow up.    Review of Systems  Constitutional: Negative.   HENT: Negative.   Eyes: Negative.   Respiratory: Negative.   Cardiovascular: Negative.   Gastrointestinal: Negative.   Endocrine: Negative.   Genitourinary: Negative.   Musculoskeletal: Positive for arthralgias.  Skin: Negative.   Allergic/Immunologic: Negative.   Neurological: Negative.   Hematological: Negative.   Psychiatric/Behavioral: Negative.   All other systems reviewed and are negative.  Past Medical History:  Diagnosis Date  . Elevated cholesterol   . Splenic laceration     Social History   Socioeconomic History  . Marital status: Married    Spouse name: Not on file  . Number of children: 3  . Years of education: Not on file  . Highest education level: Not on file  Occupational History  . Occupation: Roofer  Tobacco Use  . Smoking status: Current Every Day Smoker    Packs/day: 0.50    Types: Cigarettes  . Smokeless tobacco: Never Used  Vaping Use  . Vaping Use: Never used  Substance and Sexual Activity  . Alcohol use: No  . Drug use: Yes    Types: Marijuana  . Sexual activity: Not on file  Other Topics Concern  . Not on file  Social History Narrative   Owns his own [place]    Married    Social Determinants of Health   Financial Resource Strain: Not on file  Food Insecurity: Not on file  Transportation Needs: Not on file  Physical Activity: Not on file  Stress: Not on file  Social Connections: Not on file  Intimate Partner Violence: Not on file    Past Surgical History:  Procedure Laterality Date  .  plevis    . FRENULECTOMY, LINGUAL    . ORIF PELVIC FRACTURE  2007    Family History  Problem Relation Age of Onset  . High blood pressure Mother   . Breast cancer Maternal Grandmother   . Lung cancer Maternal Grandmother   . Diabetes Maternal Grandfather   . Alcohol abuse Maternal Grandfather   . High blood pressure Maternal Grandfather   . Kidney disease Maternal  Grandfather   . Cancer Paternal Grandmother        doesn't know which kind  . Aneurysm Brother   . Esophageal cancer Maternal Uncle   . Colon cancer Neg Hx   . Rectal cancer Neg Hx   . Stomach cancer Neg Hx     No Known Allergies  Current Outpatient Medications on File Prior to Visit  Medication Sig Dispense Refill  . cyclobenzaprine (FLEXERIL) 10 MG tablet Take 1 tablet (10 mg total) by mouth 3 (three) times daily as needed for muscle spasms. 30 tablet 0  . methylPREDNISolone (MEDROL DOSEPAK) 4 MG TBPK tablet Take as directed 21 tablet 0   No current facility-administered medications on file prior to visit.    There were no vitals taken for this visit.      Objective:   Physical Exam Vitals and nursing note reviewed.  Constitutional:      General: He is not in acute distress.    Appearance: Normal appearance. He is well-developed and normal weight.  HENT:     Head: Normocephalic and atraumatic.     Right Ear: Tympanic membrane, ear canal and external ear normal. There is no impacted cerumen.     Left Ear: Tympanic membrane, ear canal and external ear normal. There is no impacted cerumen.     Nose: Nose normal. No congestion or rhinorrhea.     Mouth/Throat:     Mouth: Mucous membranes are moist.     Pharynx: Oropharynx is clear. No oropharyngeal exudate or posterior oropharyngeal erythema.  Eyes:     General:        Right eye: No discharge.        Left eye: No discharge.     Extraocular Movements: Extraocular movements intact.     Conjunctiva/sclera: Conjunctivae normal.     Pupils: Pupils are equal, round, and reactive to light.  Neck:     Vascular: No carotid bruit.     Trachea: No tracheal deviation.  Cardiovascular:     Rate and Rhythm: Normal rate and regular rhythm.     Pulses: Normal pulses.     Heart sounds: Normal heart sounds. No murmur heard. No friction rub. No gallop.   Pulmonary:     Effort: Pulmonary effort is normal. No respiratory distress.      Breath sounds: Normal breath sounds. No stridor. No wheezing, rhonchi or rales.  Chest:     Chest wall: No tenderness.  Abdominal:     General: Bowel sounds are normal. There is no distension.     Palpations: Abdomen is soft. There is no mass.     Tenderness: There is no abdominal tenderness. There is no right CVA tenderness, left CVA tenderness, guarding or rebound.     Hernia: No hernia is  present.  Musculoskeletal:        General: No swelling, deformity or signs of injury. Normal range of motion.     Right shoulder: Tenderness present. No bony tenderness or crepitus. Normal range of motion. Normal strength.     Left shoulder: Normal.     Right lower leg: No edema.     Left lower leg: No edema.  Lymphadenopathy:     Cervical: No cervical adenopathy.  Skin:    General: Skin is warm and dry.     Capillary Refill: Capillary refill takes less than 2 seconds.     Coloration: Skin is not jaundiced or pale.     Findings: No bruising, erythema, lesion or rash.  Neurological:     General: No focal deficit present.     Mental Status: He is alert and oriented to person, place, and time.     Cranial Nerves: No cranial nerve deficit.     Sensory: No sensory deficit.     Motor: No weakness.     Coordination: Coordination normal.     Gait: Gait normal.     Deep Tendon Reflexes: Reflexes normal.  Psychiatric:        Mood and Affect: Mood normal.        Behavior: Behavior normal.        Thought Content: Thought content normal.        Judgment: Judgment normal.       Assessment & Plan:  1. Routine general medical examination at a health care facility - Needs to quit smoking - Follow up in one year or sooner if needed - CBC with Differential/Platelet; Future - Comprehensive metabolic panel; Future - Hemoglobin A1c; Future - Lipid panel; Future - TSH; Future - PSA; Future  2. Mixed hyperlipidemia -Consider statin  - CBC with Differential/Platelet; Future - Comprehensive metabolic  panel; Future - Hemoglobin A1c; Future - Lipid panel; Future - TSH; Future - PSA; Future  3. Tobacco use - Encouraged to quit  - CBC with Differential/Platelet; Future - Comprehensive metabolic panel; Future - Hemoglobin A1c; Future - Lipid panel; Future - TSH; Future - PSA; Future  4. Need for hepatitis C screening test  - Hep C Antibody; Future  5. Chronic right shoulder pain - Continue to seem like soft tissue injury but will get Xray and treat as needed - DG Shoulder Right; Future  6. Need for Tdap vaccination  - Tdap vaccine greater than or equal to 7yo IM  Dorothyann Peng, NP

## 2020-04-24 ENCOUNTER — Ambulatory Visit (INDEPENDENT_AMBULATORY_CARE_PROVIDER_SITE_OTHER)
Admission: RE | Admit: 2020-04-24 | Discharge: 2020-04-24 | Disposition: A | Discharge status: Home / Self Care | Payer: BC Managed Care – PPO | Source: Ambulatory Visit | Attending: Adult Health | Admitting: Adult Health

## 2020-04-24 ENCOUNTER — Encounter: Payer: Self-pay | Admitting: Adult Health

## 2020-04-24 ENCOUNTER — Other Ambulatory Visit: Payer: Self-pay

## 2020-04-24 ENCOUNTER — Ambulatory Visit (INDEPENDENT_AMBULATORY_CARE_PROVIDER_SITE_OTHER): Payer: BC Managed Care – PPO | Admitting: Adult Health

## 2020-04-24 VITALS — BP 120/80 | HR 80 | Temp 98.1°F | Ht 67.0 in | Wt 172.8 lb

## 2020-04-24 DIAGNOSIS — Z125 Encounter for screening for malignant neoplasm of prostate: Secondary | ICD-10-CM

## 2020-04-24 DIAGNOSIS — Z23 Encounter for immunization: Secondary | ICD-10-CM | POA: Diagnosis not present

## 2020-04-24 DIAGNOSIS — M25511 Pain in right shoulder: Secondary | ICD-10-CM | POA: Diagnosis not present

## 2020-04-24 DIAGNOSIS — G8929 Other chronic pain: Secondary | ICD-10-CM

## 2020-04-24 DIAGNOSIS — Z720 Tobacco use: Secondary | ICD-10-CM | POA: Diagnosis not present

## 2020-04-24 DIAGNOSIS — Z0000 Encounter for general adult medical examination without abnormal findings: Secondary | ICD-10-CM

## 2020-04-24 DIAGNOSIS — E782 Mixed hyperlipidemia: Secondary | ICD-10-CM

## 2020-04-24 DIAGNOSIS — Z1159 Encounter for screening for other viral diseases: Secondary | ICD-10-CM

## 2020-04-24 LAB — COMPREHENSIVE METABOLIC PANEL
ALT: 15 U/L (ref 0–53)
AST: 16 U/L (ref 0–37)
Albumin: 4.1 g/dL (ref 3.5–5.2)
Alkaline Phosphatase: 72 U/L (ref 39–117)
BUN: 9 mg/dL (ref 6–23)
CO2: 31 mEq/L (ref 19–32)
Calcium: 9.2 mg/dL (ref 8.4–10.5)
Chloride: 101 mEq/L (ref 96–112)
Creatinine, Ser: 0.68 mg/dL (ref 0.40–1.50)
GFR: 111.6 mL/min (ref >60.00)
Glucose, Bld: 90 mg/dL (ref 70–99)
Potassium: 3.9 mEq/L (ref 3.5–5.1)
Sodium: 137 mEq/L (ref 135–145)
Total Bilirubin: 0.5 mg/dL (ref 0.2–1.2)
Total Protein: 6.5 g/dL (ref 6.0–8.3)

## 2020-04-24 LAB — HEMOGLOBIN A1C: Hgb A1c MFr Bld: 5.8 % (ref 4.6–6.5)

## 2020-04-24 LAB — CBC WITH DIFFERENTIAL/PLATELET
Basophils Absolute: 0.1 10*3/uL (ref 0.0–0.1)
Basophils Relative: 1.2 % (ref 0.0–3.0)
Eosinophils Absolute: 0.2 10*3/uL (ref 0.0–0.7)
Eosinophils Relative: 3.1 % (ref 0.0–5.0)
HCT: 43.1 % (ref 39.0–52.0)
Hemoglobin: 14.8 g/dL (ref 13.0–17.0)
Lymphocytes Relative: 35.7 % (ref 12.0–46.0)
Lymphs Abs: 2 10*3/uL (ref 0.7–4.0)
MCHC: 34.3 g/dL (ref 30.0–36.0)
MCV: 86.9 fl (ref 78.0–100.0)
Monocytes Absolute: 0.3 10*3/uL (ref 0.1–1.0)
Monocytes Relative: 5.4 % (ref 3.0–12.0)
Neutro Abs: 3 10*3/uL (ref 1.4–7.7)
Neutrophils Relative %: 54.6 % (ref 43.0–77.0)
Platelets: 221 10*3/uL (ref 150.0–400.0)
RBC: 4.95 Mil/uL (ref 4.22–5.81)
RDW: 14.1 % (ref 11.5–15.5)
WBC: 5.6 10*3/uL (ref 4.0–10.5)

## 2020-04-24 LAB — LIPID PANEL
Cholesterol: 168 mg/dL (ref 0–200)
HDL: 39.3 mg/dL (ref >39.00)
LDL Cholesterol: 105 mg/dL — ABNORMAL HIGH (ref 0–99)
NonHDL: 128.83
Total CHOL/HDL Ratio: 4
Triglycerides: 118 mg/dL (ref 0.0–149.0)
VLDL: 23.6 mg/dL (ref 0.0–40.0)

## 2020-04-24 LAB — PSA: PSA: 1.45 ng/mL (ref 0.10–4.00)

## 2020-04-24 LAB — TSH: TSH: 1.11 u[IU]/mL (ref 0.35–4.50)

## 2020-04-24 NOTE — Patient Instructions (Signed)
It was great seeing you today   We will follow up with you regarding your blood work and xray   Please work on quitting smoking   I will see you back in one year or sooner if needed

## 2020-04-27 LAB — HEPATITIS C ANTIBODY
Hepatitis C Ab: NONREACTIVE
SIGNAL TO CUT-OFF: 0.01 (ref <1.00)

## 2021-05-04 ENCOUNTER — Ambulatory Visit (INDEPENDENT_AMBULATORY_CARE_PROVIDER_SITE_OTHER): Payer: BC Managed Care – PPO | Admitting: Adult Health

## 2021-05-04 ENCOUNTER — Encounter: Payer: Self-pay | Admitting: Adult Health

## 2021-05-04 VITALS — BP 130/80 | HR 96 | Temp 98.5°F | Ht 67.0 in | Wt 172.0 lb

## 2021-05-04 DIAGNOSIS — Z125 Encounter for screening for malignant neoplasm of prostate: Secondary | ICD-10-CM

## 2021-05-04 DIAGNOSIS — Z114 Encounter for screening for human immunodeficiency virus [HIV]: Secondary | ICD-10-CM

## 2021-05-04 DIAGNOSIS — E782 Mixed hyperlipidemia: Secondary | ICD-10-CM | POA: Diagnosis not present

## 2021-05-04 DIAGNOSIS — Z720 Tobacco use: Secondary | ICD-10-CM

## 2021-05-04 DIAGNOSIS — Z0000 Encounter for general adult medical examination without abnormal findings: Secondary | ICD-10-CM

## 2021-05-04 LAB — COMPREHENSIVE METABOLIC PANEL
ALT: 15 U/L (ref 0–53)
AST: 36 U/L (ref 0–37)
Albumin: 4.3 g/dL (ref 3.5–5.2)
Alkaline Phosphatase: 84 U/L (ref 39–117)
BUN: 12 mg/dL (ref 6–23)
CO2: 30 mEq/L (ref 19–32)
Calcium: 9.6 mg/dL (ref 8.4–10.5)
Chloride: 99 mEq/L (ref 96–112)
Creatinine, Ser: 0.79 mg/dL (ref 0.40–1.50)
GFR: 105.9 mL/min (ref >60.00)
Glucose, Bld: 72 mg/dL (ref 70–99)
Potassium: 4.7 mEq/L (ref 3.5–5.1)
Sodium: 137 mEq/L (ref 135–145)
Total Bilirubin: 0.5 mg/dL (ref 0.2–1.2)
Total Protein: 7.1 g/dL (ref 6.0–8.3)

## 2021-05-04 LAB — CBC WITH DIFFERENTIAL/PLATELET
Basophils Absolute: 0.1 10*3/uL (ref 0.0–0.1)
Basophils Relative: 1.2 % (ref 0.0–3.0)
Eosinophils Absolute: 0.2 10*3/uL (ref 0.0–0.7)
Eosinophils Relative: 2.8 % (ref 0.0–5.0)
HCT: 45.4 % (ref 39.0–52.0)
Hemoglobin: 15.3 g/dL (ref 13.0–17.0)
Lymphocytes Relative: 33 % (ref 12.0–46.0)
Lymphs Abs: 2 10*3/uL (ref 0.7–4.0)
MCHC: 33.6 g/dL (ref 30.0–36.0)
MCV: 86.9 fl (ref 78.0–100.0)
Monocytes Absolute: 0.4 10*3/uL (ref 0.1–1.0)
Monocytes Relative: 6.5 % (ref 3.0–12.0)
Neutro Abs: 3.4 10*3/uL (ref 1.4–7.7)
Neutrophils Relative %: 56.5 % (ref 43.0–77.0)
Platelets: 231 10*3/uL (ref 150.0–400.0)
RBC: 5.22 Mil/uL (ref 4.22–5.81)
RDW: 13.5 % (ref 11.5–15.5)
WBC: 6 10*3/uL (ref 4.0–10.5)

## 2021-05-04 LAB — LIPID PANEL
Cholesterol: 177 mg/dL (ref 0–200)
HDL: 38.2 mg/dL — ABNORMAL LOW (ref >39.00)
LDL Cholesterol: 102 mg/dL — ABNORMAL HIGH (ref 0–99)
NonHDL: 138.47
Total CHOL/HDL Ratio: 5
Triglycerides: 183 mg/dL — ABNORMAL HIGH (ref 0.0–149.0)
VLDL: 36.6 mg/dL (ref 0.0–40.0)

## 2021-05-04 LAB — TSH: TSH: 0.94 u[IU]/mL (ref 0.35–5.50)

## 2021-05-04 LAB — PSA: PSA: 1.26 ng/mL (ref 0.10–4.00)

## 2021-05-04 MED ORDER — VARENICLINE TARTRATE 0.5 MG PO TABS: 0.5000 mg | ORAL_TABLET | Freq: Two times a day (BID) | ORAL | 0 refills | Status: AC

## 2021-05-04 NOTE — Progress Notes (Signed)
? ?Subjective:  ? ? Patient ID: Devon Cantu, male    DOB: 1975-07-23, 46 y.o.   MRN: [id number] ? ?HPI ?Patient presents for yearly preventative medicine examination. He is a pleasant 46 year old male who  has a past medical history of Elevated cholesterol and Splenic laceration. ? ?Hyperlipidemia - not currently on medication.  ? ?Lab Results  ?Component Value Date  ? CHOL 168 04/24/2020  ? HDL 39.30 04/24/2020  ? LDLCALC 105 (H) 04/24/2020  ? TRIG 118.0 04/24/2020  ? CHOLHDL 4 04/24/2020  ? ?Tobacco Use- smokes about a pack a day. He is ready to quit smoking. He is having a grandchild in two months and would like to quit for her.  ? ?All immunizations and health maintenance protocols were reviewed with the patient and needed orders were placed. ? ?Appropriate screening laboratory values were ordered for the patient including screening of hyperlipidemia, renal function and hepatic function. ?If indicated by BPH, a PSA was ordered. ? ?Medication reconciliation,  past medical history, social history, problem list and allergies were reviewed in detail with the patient ? ?Goals were established with regard to weight loss, exercise, and  diet in compliance with medications. 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 Tries to eat healthy.  ?Wt Readings from Last 3 Encounters:  ?05/04/21 172 lb (78 kg)  ?04/24/20 172 lb 12.8 oz (78.4 kg)  ?02/13/20 173 lb (78.5 kg)  ? ?He is up to date on routine colon cancer screening  ? ?Review of Systems  ?Constitutional: Negative.   ?HENT: Negative.    ?Eyes: Negative.   ?Respiratory: Negative.    ?Cardiovascular: Negative.   ?Gastrointestinal: Negative.   ?Endocrine: Negative.   ?Genitourinary: Negative.   ?Musculoskeletal: Negative.   ?Skin: Negative.   ?Allergic/Immunologic: Negative.   ?Neurological: Negative.   ?Hematological: Negative.   ?Psychiatric/Behavioral: Negative.    ?All other systems reviewed and are negative. ? ?Past Medical History:  ?Diagnosis Date  ?  Elevated cholesterol   ? Splenic laceration   ? ? ?Social History  ? ?Socioeconomic History  ? Marital status: Married  ?  Spouse name: Not on file  ? Number of children: 3  ? Years of education: Not on file  ? Highest education level: Not on file  ?Occupational History  ? Occupation: Roofer  ?Tobacco Use  ? Smoking status: Every Day  ?  Packs/day: 0.50  ?  Types: Cigarettes  ? Smokeless tobacco: Never  ?Vaping Use  ? Vaping Use: Never used  ?Substance and Sexual Activity  ? Alcohol use: No  ? Drug use: Yes  ?  Types: Marijuana  ? Sexual activity: Not on file  ?Other Topics Concern  ? Not on file  ?Social History Narrative  ? 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 Married   ? ?Social Determinants of Health  ? ?Financial Resource Strain: Not on file  ?Food Insecurity: Not on file  ?Transportation Needs: Not on file  ?Physical Activity: Not on file  ?Stress: Not on file  ?Social Connections: Not on file  ?Intimate Partner Violence: Not on file  ? ? ?Past Surgical History:  ?Procedure Laterality Date  ?  plevis    ? FRENULECTOMY, LINGUAL    ? ORIF PELVIC FRACTURE  2007  ? ? ?Family History  ?Problem Relation Age of Onset  ? High blood pressure Mother   ? Breast cancer Maternal Grandmother   ? Lung cancer Maternal Grandmother   ? Diabetes Maternal Grandfather   ?  Alcohol abuse Maternal Grandfather   ? High blood pressure Maternal Grandfather   ? Kidney disease Maternal Grandfather   ? Cancer Paternal Grandmother   ?     doesn't know which kind  ? Aneurysm Brother   ? Esophageal cancer Maternal Uncle   ? Colon cancer Neg Hx   ? Rectal cancer Neg Hx   ? Stomach cancer Neg Hx   ? ? ?No Known Allergies ? ?No current outpatient medications on file prior to visit.  ? ?No current facility-administered medications on file prior to visit.  ? ? ?BP 130/80   Pulse 96   Temp 98.5 ?F (36.9 ?C)   Ht 5' 7"  (1.702 m)   Wt 172 lb (78 kg)   SpO2 96%   BMI 26.94 kg/m?  ? ? ?   ?Objective:  ? Physical Exam ?Vitals and nursing note  reviewed.  ?Constitutional:   ?   General: He is not in acute distress. ?   Appearance: Normal appearance. He is well-developed and normal weight.  ?HENT:  ?   Head: Normocephalic and atraumatic.  ?   Right Ear: Tympanic membrane, ear canal and external ear normal. There is no impacted cerumen.  ?   Left Ear: Tympanic membrane, ear canal and external ear normal. There is no impacted cerumen.  ?   Nose: Nose normal. No congestion or rhinorrhea.  ?   Mouth/Throat:  ?   Mouth: Mucous membranes are moist.  ?   Pharynx: Oropharynx is clear. No oropharyngeal exudate or posterior oropharyngeal erythema.  ?Eyes:  ?   General:     ?   Right eye: No discharge.     ?   Left eye: No discharge.  ?   Extraocular Movements: Extraocular movements intact.  ?   Conjunctiva/sclera: Conjunctivae normal.  ?   Pupils: Pupils are equal, round, and reactive to light.  ?Neck:  ?   Vascular: No carotid bruit.  ?   Trachea: No tracheal deviation.  ?Cardiovascular:  ?   Rate and Rhythm: Normal rate and regular rhythm.  ?   Pulses: Normal pulses.  ?   Heart sounds: Normal heart sounds. No murmur heard. ?  No friction rub. No gallop.  ?Pulmonary:  ?   Effort: Pulmonary effort is normal. No respiratory distress.  ?   Breath sounds: Normal breath sounds. No stridor. No wheezing, rhonchi or rales.  ?Chest:  ?   Chest wall: No tenderness.  ?Abdominal:  ?   General: Bowel sounds are normal. There is no distension.  ?   Palpations: Abdomen is soft. There is no mass.  ?   Tenderness: There is no abdominal tenderness. There is no right CVA tenderness, left CVA tenderness, guarding or rebound.  ?   Hernia: No hernia is present.  ?Musculoskeletal:     ?   General: No swelling, tenderness, deformity or signs of injury. Normal range of motion.  ?   Right lower leg: No edema.  ?   Left lower leg: No edema.  ?Lymphadenopathy:  ?   Cervical: No cervical adenopathy.  ?Skin: ?   General: Skin is warm and dry.  ?   Capillary Refill: Capillary refill takes less  than 2 seconds.  ?   Coloration: Skin is not jaundiced or pale.  ?   Findings: No bruising, erythema, lesion or rash.  ?Neurological:  ?   General: No focal deficit present.  ?   Mental Status: He is alert and oriented to  person, place, and time.  ?   Cranial Nerves: No cranial nerve deficit.  ?   Sensory: No sensory deficit.  ?   Motor: No weakness.  ?   Coordination: Coordination normal.  ?   Gait: Gait normal.  ?   Deep Tendon Reflexes: Reflexes normal.  ?Psychiatric:     ?   Mood and Affect: Mood normal.     ?   Behavior: Behavior normal.     ?   Thought Content: Thought content normal.     ?   Judgment: Judgment normal.  ? ?   ?Assessment & Plan:  ?1. Routine general medical examination at a health care facility ?- Encouraged lifestyle modifications  ?- Follow up in one year or sooner if needed ?- CBC with Differential/Platelet; Future ?- Comprehensive metabolic panel; Future ?- Lipid panel; Future ?- TSH; Future ?- TSH ?- Lipid panel ?- Comprehensive metabolic panel ?- CBC with Differential/Platelet ? ?2. Mixed hyperlipidemia ?- Consider statin  ?- CBC with Differential/Platelet; Future ?- Comprehensive metabolic panel; Future ?- Lipid panel; Future ?- TSH; Future ?- TSH ?- Lipid panel ?- Comprehensive metabolic panel ?- CBC with Differential/Platelet ? ?3. Tobacco use ?- we discussed options to help him quit smoking. He would like to start chantix. Follow up in one month if needed ?- CBC with Differential/Platelet; Future ?- Comprehensive metabolic panel; Future ?- Lipid panel; Future ?- TSH; Future ?- varenicline (CHANTIX) 0.5 MG tablet; Take 1 tablet (0.5 mg total) by mouth 2 (two) times daily.  Dispense: 60 tablet; Refill: 0 ?- TSH ?- Lipid panel ?- Comprehensive metabolic panel ?- CBC with Differential/Platelet ? ?4. Prostate cancer screening ? ?- PSA; Future ?- PSA ? ?5. Encounter for screening for HIV ? ?- HIV Antibody (routine testing w rflx); Future ?- HIV Antibody (routine testing w rflx) ? ? ?

## 2021-05-05 LAB — HIV ANTIBODY (ROUTINE TESTING W REFLEX): HIV 1&2 Ab, 4th Generation: NONREACTIVE

## 2021-10-05 ENCOUNTER — Telehealth (INDEPENDENT_AMBULATORY_CARE_PROVIDER_SITE_OTHER): Payer: BC Managed Care – PPO | Admitting: Adult Health

## 2021-10-05 ENCOUNTER — Encounter: Payer: Self-pay | Admitting: Adult Health

## 2021-10-05 VITALS — Ht 67.0 in | Wt 172.0 lb

## 2021-10-05 DIAGNOSIS — U071 COVID-19: Secondary | ICD-10-CM

## 2021-10-05 MED ORDER — NIRMATRELVIR/RITONAVIR (PAXLOVID)TABLET: 3.0000 | ORAL_TABLET | Freq: Two times a day (BID) | ORAL | 0 refills | Status: AC

## 2021-10-05 NOTE — Progress Notes (Signed)
Virtual Visit via Video Note  I connected with Devon Cantu  on 10/05/21 at 11:45 AM EDT by a video enabled telemedicine application and verified that I am speaking with the correct person using two identifiers.  Location patient: home Location provider:work or home office Persons participating in the virtual visit: patient, provider  I discussed the limitations of evaluation and management by telemedicine and the availability of in person appointments. The patient expressed understanding and agreed to proceed.   HPI:  46 year old male who  has a past medical history of Elevated cholesterol and Splenic laceration.  He is being evaluated today for Covid 19 infection. He tested positive yesterday when his symptoms began. His symptoms currently include body aches but tylenol helps with this. He has not had any fevers, chills, shortness of breath, wheezing, sore throat or nasal congestion   He does continue to smoke and would like antiviral therapy do to this.   ROS: See pertinent positives and negatives per HPI.  Past Medical History:  Diagnosis Date   Elevated cholesterol    Splenic laceration     Past Surgical History:  Procedure Laterality Date    plevis     FRENULECTOMY, LINGUAL     ORIF PELVIC FRACTURE  2007    Family History  Problem Relation Age of Onset   High blood pressure Mother    Breast cancer Maternal Grandmother    Lung cancer Maternal Grandmother    Diabetes Maternal Grandfather    Alcohol abuse Maternal Grandfather    High blood pressure Maternal Grandfather    Kidney disease Maternal Grandfather    Cancer Paternal Grandmother        doesn't know which kind   Aneurysm Brother    Esophageal cancer Maternal Uncle    Colon cancer Neg Hx    Rectal cancer Neg Hx    Stomach cancer Neg Hx       No current outpatient medications on file.  EXAM:  VITALS per patient if applicable:  GENERAL: alert, oriented, appears well and in no acute  distress  HEENT: atraumatic, conjunttiva clear, no obvious abnormalities on inspection of external nose and ears  NECK: normal movements of the head and neck  LUNGS: on inspection no signs of respiratory distress, breathing rate appears normal, no obvious gross SOB, gasping or wheezing  CV: no obvious cyanosis  MS: moves all visible extremities without noticeable abnormality  PSYCH/NEURO: pleasant and cooperative, no obvious depression or anxiety, speech and thought processing grossly intact  ASSESSMENT AND PLAN:  Discussed the following assessment and plan:  1. COVID-19 virus infection  - nirmatrelvir/ritonavir EUA (PAXLOVID) 20 x 150 MG & 10 x 100MG TABS; Take 3 tablets by mouth 2 (two) times daily for 5 days. (Take nirmatrelvir 150 mg two tablets twice daily for 5 days and ritonavir 100 mg one tablet twice daily for 5 days) Patient GFR is 105  Dispense: 30 tablet; Refill: 0      I discussed the assessment and treatment plan with the patient. The patient was provided an opportunity to ask questions and all were answered. The patient agreed with the plan and demonstrated an understanding of the instructions.   The patient was advised to call back or seek an in-person evaluation if the symptoms worsen or if the condition fails to improve as anticipated.   Dorothyann Peng, NP

## 2022-05-30 ENCOUNTER — Encounter: Payer: Self-pay | Admitting: Family Medicine

## 2022-05-30 ENCOUNTER — Ambulatory Visit (INDEPENDENT_AMBULATORY_CARE_PROVIDER_SITE_OTHER): Payer: Self-pay | Admitting: Family Medicine

## 2022-05-30 VITALS — BP 148/100 | HR 84 | Resp 12 | Ht 67.0 in | Wt 168.0 lb

## 2022-05-30 DIAGNOSIS — R519 Headache, unspecified: Secondary | ICD-10-CM

## 2022-05-30 DIAGNOSIS — I10 Essential (primary) hypertension: Secondary | ICD-10-CM

## 2022-05-30 MED ORDER — AMLODIPINE BESYLATE 5 MG PO TABS: 5.0000 mg | ORAL_TABLET | Freq: Every day | ORAL | 1 refills | Status: DC

## 2022-05-30 NOTE — Progress Notes (Signed)
ACUTE VISIT Chief Complaint  Patient presents with   Headache    Started Saturday.    Hypertension    Yesterday 158/110   HPI: [name] is a 47 y.o. male with no significant PMHx here today with his wife complaining of concern aboutpersistent headache since Saturday, which he describes as throbbing and localized to his temples.  Headache  This is a new problem. Pertinent negatives include no abdominal pain, abnormal behavior, back pain, blurred vision, coughing, dizziness, drainage, ear pain, eye pain, eye redness, facial sweating, fever, hearing loss, insomnia, loss of balance, muscle aches, neck pain, numbness, phonophobia, photophobia, scalp tenderness, seizures, sinus pressure, sore throat, swollen glands, tingling, visual change, weakness or weight loss. Nothing aggravates the symptoms. He has tried acetaminophen for the symptoms. The treatment provided no relief. There is no history of hypertension, migraine headaches, recent head traumas or sinus disease.   He rates the pain as varying between 2 to 10 out of 10 in intensity over the weekend. He reports that typical over-the-counter medications like Tylenol have not alleviated the pain.  He has had similar headaches in the past but notes that this headache has lasted longer than previous episodes, which occurred about six months ago. He has not identified exacerbating or alleviating factors. He denies associated symptoms such as nausea, vomiting, fever, or chills.  Additionally, he reports elevated blood pressure readings, with a recent measurement at home showing 158/110. No known history of hypertension. He also notes a recent reduction in caffeine intake, which seemed to slightly alleviate the headache when reintroduced. His wife mentions increased sleepiness recently but no other new symptoms.  He denies experiencing chest pain, difficulty breathing, palpitations, orthopnea, PND, or edema.  Review of Systems   Constitutional:  Negative for fever and weight loss.  HENT:  Negative for ear pain, hearing loss, sinus pressure and sore throat.   Eyes:  Negative for blurred vision, photophobia, pain and redness.  Respiratory:  Negative for cough.   Gastrointestinal:  Negative for abdominal pain.  Musculoskeletal:  Negative for back pain and neck pain.  Neurological:  Positive for headaches. Negative for dizziness, tingling, seizures, weakness, numbness and loss of balance.  Psychiatric/Behavioral:  The patient does not have insomnia.   See other pertinent positives and negatives in HPI.  No current outpatient medications on file prior to visit.   No current facility-administered medications on file prior to visit.   Past Medical History:  Diagnosis Date   Elevated cholesterol    Splenic laceration    No Known Allergies  Social History   Socioeconomic History   Marital status: Married    Spouse name: Not on file   Number of children: 3   Years of education: Not on file   Highest education level: Not on file  Occupational History   Occupation: Roofer  Tobacco Use   Smoking status: Every Day    Packs/day: .5    Types: Cigarettes   Smokeless tobacco: Never  Vaping Use   Vaping Use: Never used  Substance and Sexual Activity   Alcohol use: No   Drug use: Yes    Types: Marijuana   Sexual activity: Not on file  Other Topics Concern   Not on file  Social History Narrative   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  Physical Exam Vitals and nursing note reviewed.  Constitutional:      General: He is not in acute  distress.    Appearance: He is well-developed.  HENT:     Head: Normocephalic and atraumatic.     Mouth/Throat:     Mouth: Mucous membranes are moist.  Eyes:     Extraocular Movements: Extraocular movements intact.     Conjunctiva/sclera: Conjunctivae normal.     Pupils: Pupils are equal, round, and reactive to light.     Funduscopic exam:    Right eye: No hemorrhage, exudate or papilledema.        Left eye: No hemorrhage or exudate.  Cardiovascular:     Rate and Rhythm: Normal rate and regular rhythm.     Pulses:          Posterior tibial pulses are 2+ on the right side and 2+ on the left side.     Heart sounds: No murmur heard. Pulmonary:     Effort: Pulmonary effort is normal. No respiratory distress.     Breath sounds: Normal breath sounds.  Abdominal:     Palpations: Abdomen is soft. There is no mass.     Tenderness: There is no abdominal tenderness.  Musculoskeletal:     Cervical back: Neck supple.  Lymphadenopathy:     Cervical: No cervical adenopathy.  Skin:    General: Skin is warm.     Findings: No erythema or rash.  Neurological:     Mental Status: He is alert and oriented to person, place, and time.     Cranial Nerves: No cranial nerve deficit.     Gait: Gait normal.     Deep Tendon Reflexes:     Reflex Scores:      Bicep reflexes are 2+ on the right side and 2+ on the left side.      Patellar reflexes are 2+ on the right side and 2+ on the left side. Psychiatric:        Mood and Affect: Mood is anxious. Mood is not depressed.   ASSESSMENT AND PLAN:  [name] was seen today for headaches and elevated BP.  Headache, unspecified headache type Bitemporal and mainly. We discussed possible etiologies, explained to patient that history and examination today do not suggest a serious process. Elevated BP can certainly be a contributing factor. Tension headache is also to be considered. He is very concerned, states that some family members have "died" after  having severe headache.He would like imaging done, head CT order placed. He was clearly instructed about warning signs. Follow-up with PCP in 4 to 6 weeks, before if needed. Further recommendation will be given according to lab results.  -     CT HEAD WO CONTRAST ( ); Future -     CBC; Future -     C-reactive protein; Future  Essential (primary) hypertension Re-checked BP: 148/100. We discussed diagnosis criteria for hypertension as well as possible complications if not adequately treated. Recommend amlodipine 5 mg daily. Low-salt diet. Continue monitoring BP at home. Follow-up in 6 weeks with PCP, before if needed.  -     CT HEAD WO CONTRAST ( ); Future -  Basic metabolic panel; Future -     TSH; Future -     CBC; Future -     amLODIPine Besylate; Take 1 tablet (5 mg total) by mouth daily.  Dispense: 30 tablet; Refill: 1  We do not have lab service today, he could go to [facility] or come back tomorrow am, he prefers the later option.  Return in about 6 weeks (around 07/11/2022) for HTN and headache with PCP.  Jon Kasparek G. [place], MD  [facility]. [place] office.

## 2022-05-30 NOTE — Patient Instructions (Signed)
A few things to remember from today's visit:  Headache, unspecified headache type - Plan: CT HEAD WO CONTRAST ( ), CBC, C-reactive protein  Essential (primary) hypertension - Plan: CT HEAD WO CONTRAST ( ), Basic metabolic panel, TSH, CBC, amLODipine (NORVASC) 5 MG tablet  Today Amlodipine 5 mg started to take once daily. Continue monitoring blood pressure regularly.  Do not use My Chart to request refills or for acute issues that need immediate attention. If you send a my chart message, it may take a few days to be addressed, specially if I am not in the office.  Please be sure medication list is accurate. If a new problem present, please set up appointment sooner than planned today.

## 2022-05-31 ENCOUNTER — Other Ambulatory Visit (INDEPENDENT_AMBULATORY_CARE_PROVIDER_SITE_OTHER): Payer: Self-pay

## 2022-05-31 ENCOUNTER — Ambulatory Visit (HOSPITAL_BASED_OUTPATIENT_CLINIC_OR_DEPARTMENT_OTHER)
Admission: RE | Admit: 2022-05-31 | Discharge: 2022-05-31 | Disposition: A | Discharge status: Home / Self Care | Payer: Self-pay | Source: Ambulatory Visit | Attending: Family Medicine | Admitting: Family Medicine

## 2022-05-31 DIAGNOSIS — I10 Essential (primary) hypertension: Secondary | ICD-10-CM | POA: Insufficient documentation

## 2022-05-31 DIAGNOSIS — R519 Headache, unspecified: Secondary | ICD-10-CM

## 2022-05-31 LAB — BASIC METABOLIC PANEL
BUN: 17 mg/dL (ref 6–23)
CO2: 28 mEq/L (ref 19–32)
Calcium: 9.8 mg/dL (ref 8.4–10.5)
Chloride: 101 mEq/L (ref 96–112)
Creatinine, Ser: 0.92 mg/dL (ref 0.40–1.50)
GFR: 98.41 mL/min (ref >60.00)
Glucose, Bld: 97 mg/dL (ref 70–99)
Potassium: 4 mEq/L (ref 3.5–5.1)
Sodium: 138 mEq/L (ref 135–145)

## 2022-05-31 LAB — CBC
HCT: 46.5 % (ref 39.0–52.0)
Hemoglobin: 15.9 g/dL (ref 13.0–17.0)
MCHC: 34.2 g/dL (ref 30.0–36.0)
MCV: 86.3 fl (ref 78.0–100.0)
Platelets: 241 10*3/uL (ref 150.0–400.0)
RBC: 5.39 Mil/uL (ref 4.22–5.81)
RDW: 14 % (ref 11.5–15.5)
WBC: 7.2 10*3/uL (ref 4.0–10.5)

## 2022-05-31 LAB — C-REACTIVE PROTEIN: CRP: <1 mg/dL (ref 0.5–20.0)

## 2022-05-31 LAB — TSH: TSH: 1.02 u[IU]/mL (ref 0.35–5.50)

## 2022-06-01 ENCOUNTER — Telehealth: Payer: Self-pay | Admitting: Adult Health

## 2022-06-01 NOTE — Telephone Encounter (Signed)
Pt wife angela is calling and would like pt blood work result

## 2022-07-12 ENCOUNTER — Ambulatory Visit (INDEPENDENT_AMBULATORY_CARE_PROVIDER_SITE_OTHER): Payer: Self-pay | Admitting: Adult Health

## 2022-07-12 ENCOUNTER — Encounter: Payer: Self-pay | Admitting: Adult Health

## 2022-07-12 DIAGNOSIS — I10 Essential (primary) hypertension: Secondary | ICD-10-CM

## 2022-07-12 MED ORDER — AMLODIPINE BESYLATE 5 MG PO TABS: 5.0000 mg | ORAL_TABLET | Freq: Every day | ORAL | 3 refills | Status: DC

## 2022-07-12 NOTE — Progress Notes (Signed)
Subjective:    Patient ID: Devon Cantu, male    DOB: [birth year], 47 y.o.   MRN: [id number]  HPI 47 year old male who  has a past medical history of Elevated cholesterol and Splenic laceration.  He was seen by another provider in the office about 5 weeks ago for persistent headache that he had for a few days.  This headache was described as a throbbing and localized to his temples.  He had tried Tylenol for his symptoms but this provided no relief.  He was not have any blurred vision, dizziness, ear pain, eye pain, facial sweating, hearing loss, loss of balance, phonophobia, photophobia, or scalp tenderness.  Furthermore he reported elevated blood pressure readings at home with a reading of 158/110.  He did not have a prior history of hypertension.  He had a CT of the head done which showed no intracranial abnormality.  It was recommended that he start amlodipine 5 mg daily, low-salt diet, and continue to monitor BP at home.  Today he reports that he has not been checking his blood pressure at home. He has been taking his Norvasc PRN when he gets a headache. When he does take Norvasc the headache goes away. He does not have a headache today   Review of Systems See HPI   Past Medical History:  Diagnosis Date   Elevated cholesterol    Splenic laceration     Social History   Socioeconomic History   Marital status: Married    Spouse name: Not on file   Number of children: 3   Years of education: Not on file   Highest education level: Not on file  Occupational History   Occupation: Roofer  Tobacco Use   Smoking status: Every Day    Packs/day: .5    Types: Cigarettes   Smokeless tobacco: Never  Vaping Use   Vaping Use: Never used  Substance and Sexual Activity   Alcohol use: No   Drug use: Yes    Types: Marijuana   Sexual activity: Not on file  Other Topics Concern   Not on file  Social History Narrative   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    Married    Social  Determinants of Corporate investment banker Strain: Not on file  Food Insecurity: Not on file  Transportation Needs: Not on file  Physical Activity: Not on file  Stress: Not on file  Social Connections: Not on file  Intimate Partner Violence: Not on file    Past Surgical History:  Procedure Laterality Date    plevis     FRENULECTOMY, LINGUAL     ORIF PELVIC FRACTURE  2007    Family History  Problem Relation Age of Onset   High blood pressure Mother    Breast cancer Maternal Grandmother    Lung cancer Maternal Grandmother    Diabetes Maternal Grandfather    Alcohol abuse Maternal Grandfather    High blood pressure Maternal Grandfather    Kidney disease Maternal Grandfather    Cancer Paternal Grandmother        doesn't know which kind   Aneurysm Brother    Esophageal cancer Maternal Uncle    Colon cancer Neg Hx    Rectal cancer Neg Hx    Stomach cancer Neg Hx     No Known Allergies  Current Outpatient Medications on File Prior to Visit  Medication Sig Dispense Refill   amLODipine (NORVASC) 5 MG tablet Take 1 tablet (5 mg  total) by mouth daily. 30 tablet 1   No current facility-administered medications on file prior to visit.    BP (!) 144/90   Pulse 90   Temp 98.5 F (36.9 C) (Oral)   Ht 5\' 7"  (1.702 m)   Wt 166 lb (75.3 kg)   SpO2 98%   BMI 26.00 kg/m       Objective:   Physical Exam Vitals and nursing note reviewed.  Constitutional:      Appearance: Normal appearance.  Cardiovascular:     Rate and Rhythm: Normal rate and regular rhythm.     Pulses: Normal pulses.     Heart sounds: Normal heart sounds.  Pulmonary:     Effort: Pulmonary effort is normal.     Breath sounds: Normal breath sounds.  Skin:    General: Skin is warm and dry.  Neurological:     General: No focal deficit present.     Mental Status: He is alert and oriented to person, place, and time.  Psychiatric:        Mood and Affect: Mood normal.        Behavior: Behavior normal.         Thought Content: Thought content normal.        Judgment: Judgment normal.       Assessment & Plan:   1. Essential (primary) hypertension - Encouraged to take medication daily  - Check BP at home  - Follow up if BP not at goal of 120/80's or if headaches continue when taking medication daily  - amLODipine (NORVASC) 5 MG tablet; Take 1 tablet (5 mg total) by mouth daily.  Dispense: 90 tablet; Refill: 3   Shirline Frees, NP

## 2022-09-13 IMAGING — DX DG SHOULDER 2+V*R*
3 series · 3 of 3 positions shown · non-contrast
Comparison: None.

CLINICAL DATA: Chronic right shoulder pain.  No known injury.

EXAM:
RIGHT SHOULDER - 2+ VIEW

[grashey]
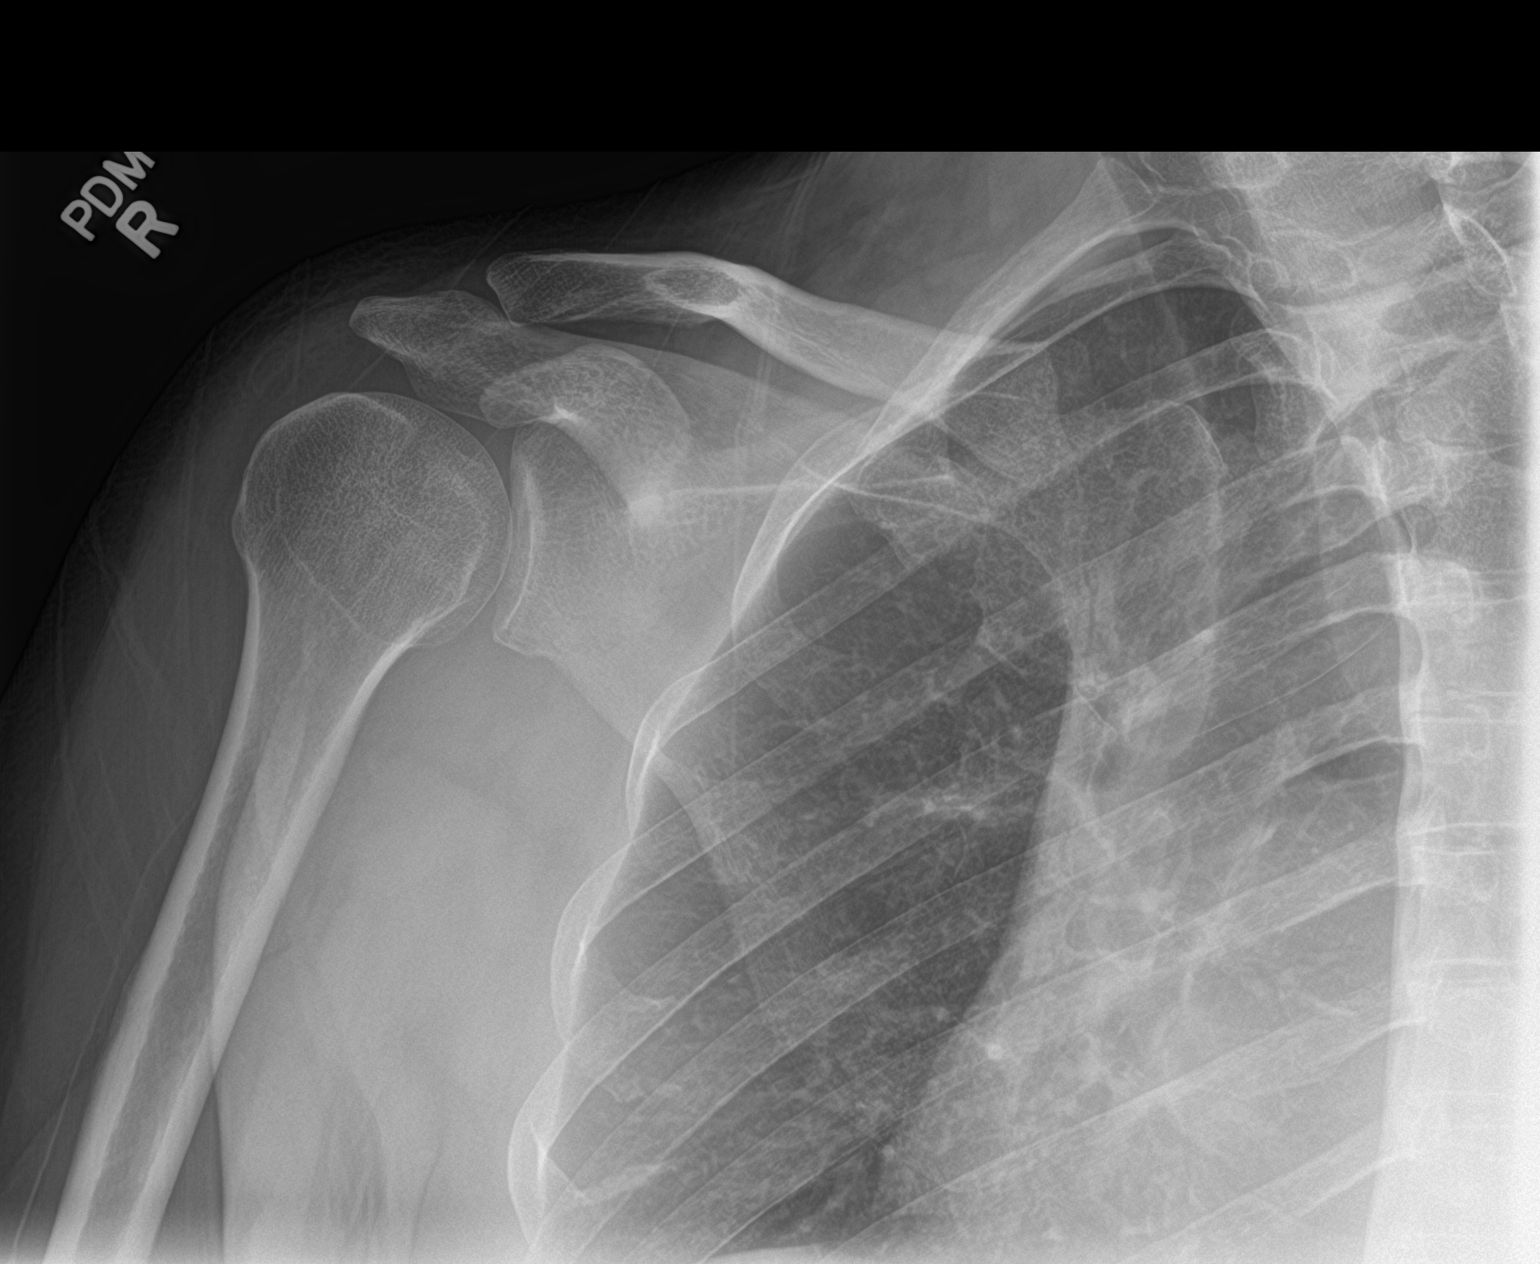

[y view]
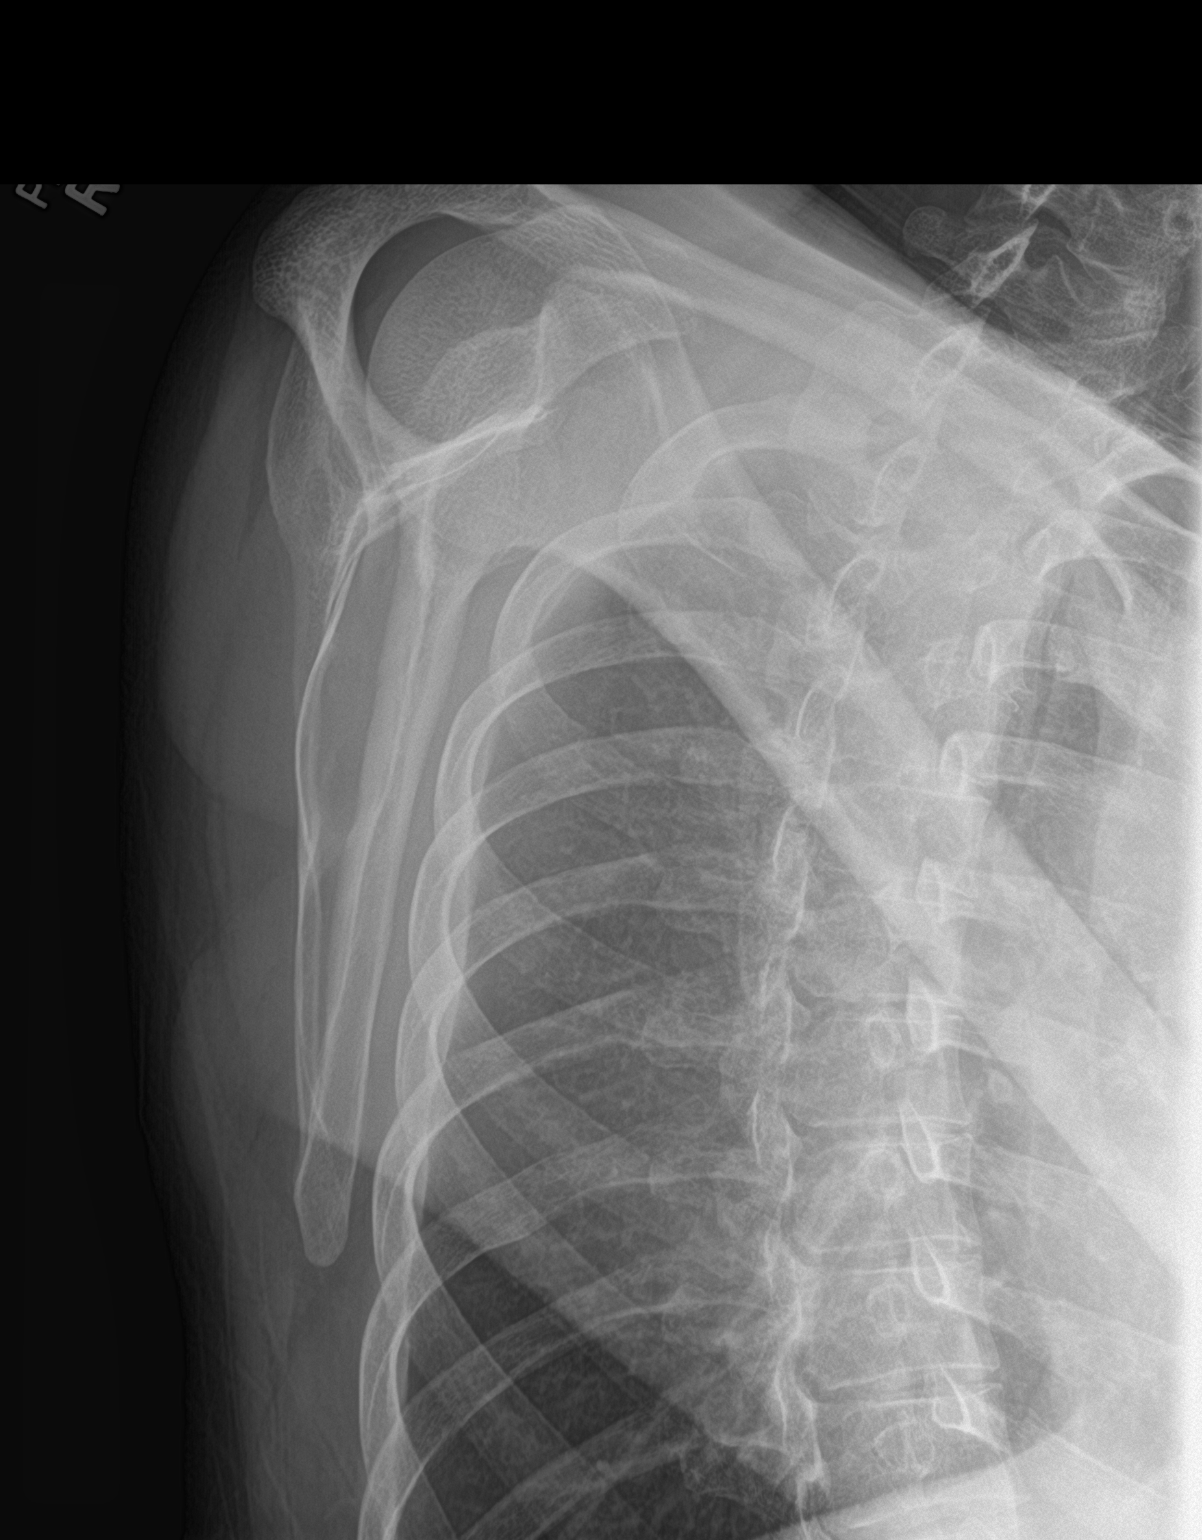

[shoulder axial]
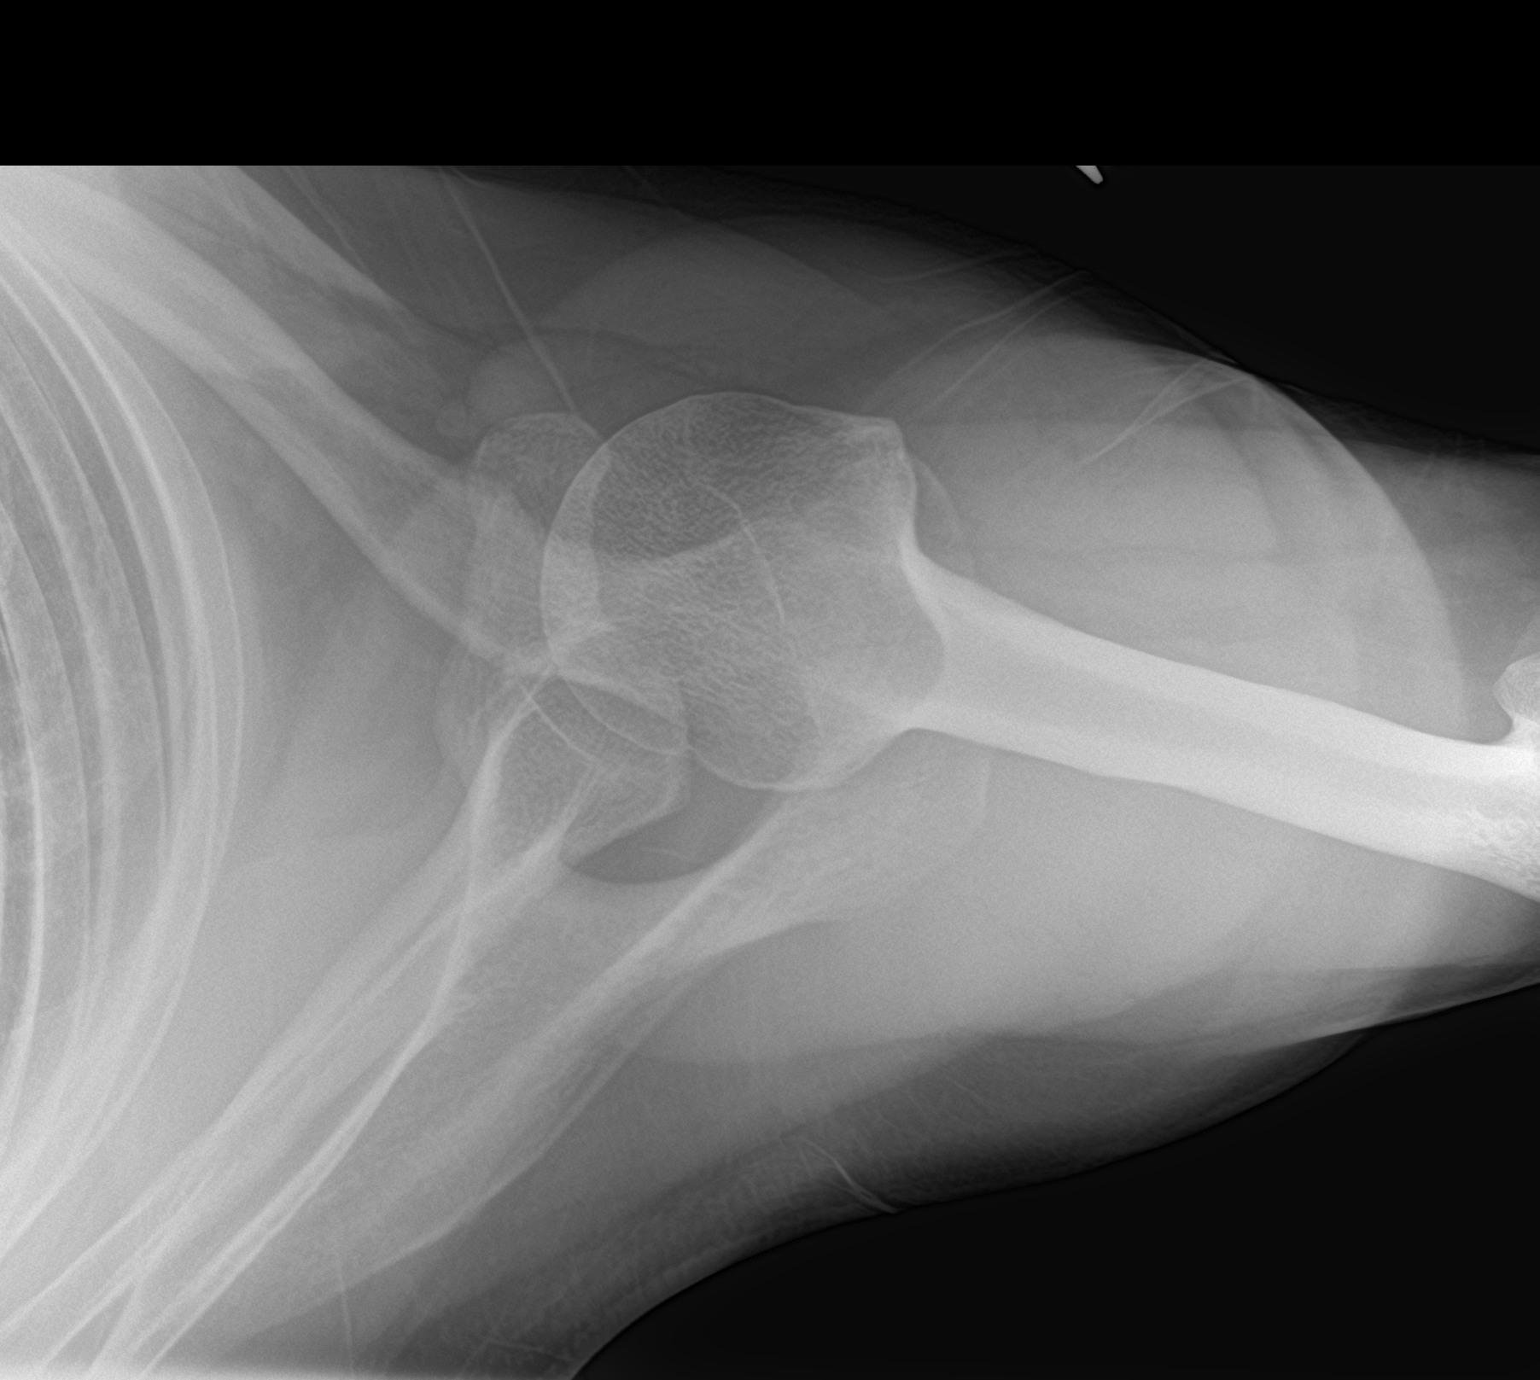

[3 of 3 positions shown; findings below may reference images not displayed]

FINDINGS: There is no evidence of fracture or dislocation. There is no
evidence of arthropathy or other focal bone abnormality. Soft
tissues are unremarkable.
IMPRESSION: Negative.

## 2023-01-31 ENCOUNTER — Ambulatory Visit (INDEPENDENT_AMBULATORY_CARE_PROVIDER_SITE_OTHER): Payer: Self-pay | Admitting: Family Medicine

## 2023-01-31 ENCOUNTER — Encounter: Payer: Self-pay | Admitting: Family Medicine

## 2023-01-31 VITALS — BP 128/80 | HR 105 | Temp 98.7°F | Resp 16 | Ht 67.0 in | Wt 170.0 lb

## 2023-01-31 DIAGNOSIS — Z23 Encounter for immunization: Secondary | ICD-10-CM | POA: Diagnosis not present

## 2023-01-31 DIAGNOSIS — E782 Mixed hyperlipidemia: Secondary | ICD-10-CM

## 2023-01-31 DIAGNOSIS — I10 Essential (primary) hypertension: Secondary | ICD-10-CM | POA: Diagnosis not present

## 2023-01-31 DIAGNOSIS — R7303 Prediabetes: Secondary | ICD-10-CM | POA: Diagnosis not present

## 2023-01-31 LAB — COMPREHENSIVE METABOLIC PANEL
ALT: 15 U/L (ref 0–53)
AST: 33 U/L (ref 0–37)
Albumin: 4.7 g/dL (ref 3.5–5.2)
Alkaline Phosphatase: 92 U/L (ref 39–117)
BUN: 13 mg/dL (ref 6–23)
CO2: 28 meq/L (ref 19–32)
Calcium: 9.9 mg/dL (ref 8.4–10.5)
Chloride: 100 meq/L (ref 96–112)
Creatinine, Ser: 0.85 mg/dL (ref 0.40–1.50)
GFR: 102.32 mL/min (ref >60.00)
Glucose, Bld: 96 mg/dL (ref 70–99)
Potassium: 4.5 meq/L (ref 3.5–5.1)
Sodium: 139 meq/L (ref 135–145)
Total Bilirubin: 0.5 mg/dL (ref 0.2–1.2)
Total Protein: 7.5 g/dL (ref 6.0–8.3)

## 2023-01-31 LAB — LIPID PANEL
Cholesterol: 209 mg/dL — ABNORMAL HIGH (ref 0–200)
HDL: 40.7 mg/dL (ref >39.00)
LDL Cholesterol: 142 mg/dL — ABNORMAL HIGH (ref 0–99)
NonHDL: 167.88
Total CHOL/HDL Ratio: 5
Triglycerides: 127 mg/dL (ref 0.0–149.0)
VLDL: 25.4 mg/dL (ref 0.0–40.0)

## 2023-01-31 LAB — HEMOGLOBIN A1C: Hgb A1c MFr Bld: 6.1 % (ref 4.6–6.5)

## 2023-01-31 MED ORDER — AMLODIPINE BESYLATE 5 MG PO TABS: 5.0000 mg | ORAL_TABLET | Freq: Every day | ORAL | 3 refills | Status: DC

## 2023-01-31 NOTE — Assessment & Plan Note (Addendum)
 Last HgA1C 5.8 in 04/2020. Encouraged consistency with a healthy life style for diabetes prevention.

## 2023-01-31 NOTE — Progress Notes (Signed)
 ACUTE VISIT Chief Complaint  Patient presents with   Hypertension   HPI: [name] is a 48 y.o. male with a PMHx significant for HTN, rectal bleeding, and tobacco use, who is here today for chronic follow up.    Hypertension:  Medications: Currently on amlodipine  5 mg daily.  BP readings at home: He checks his BP regularly at home. He says his readings are good, but doesn't have his log with him today. Side effects: none Eye exam: His last exam was last month. He gets it done through his job.  Negative for visual changes, exertional chest pain, dyspnea, palpitations, focal weakness, or edema.  Lab Results  Component Value Date   CREATININE 0.92 05/31/2022   BUN 17 05/31/2022   NA 138 05/31/2022   K 4.0 05/31/2022   CL 101 05/31/2022   CO2 28 05/31/2022   Seen on 05/30/22 for headache. He says he has not had a headache for awhile.  He is still smoking.   HLD on non pharmacologic treatment. Component     Latest Ref Rng 05/04/2021  Cholesterol     0 - 200 mg/dL 822   Triglycerides     0.0 - 149.0 mg/dL 816.9 (H)   HDL Cholesterol     >39.00 mg/dL 61.79 (L)   VLDL     0.0 - 40.0 mg/dL 63.3   LDL (calc)     0 - 99 mg/dL 897 (H)   Total CHOL/HDL Ratio 5   NonHDL 138.47     Prediabetes: Negative for polydipsia,polyuria, or polyphagia. HgA1C was 5.8 in 04/2020.  Review of Systems  Constitutional:  Negative for chills and fever.  HENT:  Negative for nosebleeds and sore throat.   Respiratory:  Negative for cough and wheezing.   Gastrointestinal:  Negative for abdominal pain, nausea and vomiting.  Endocrine: Negative for cold intolerance and heat intolerance.  Genitourinary:  Negative for decreased urine volume, dysuria and hematuria.  Skin:  Negative for rash.  Neurological:  Negative for syncope and facial asymmetry.  See other pertinent positives and negatives in HPI.  Current Outpatient Medications on File Prior to Visit  Medication Sig Dispense  Refill   amLODipine  (NORVASC ) 5 MG tablet Take 1 tablet (5 mg total) by mouth daily. 90 tablet 3   No current facility-administered medications on file prior to visit.    Past Medical History:  Diagnosis Date   Elevated cholesterol    Splenic laceration    No Known Allergies  Social History   Socioeconomic History   Marital status: Married    Spouse name: Not on file   Number of children: 3   Years of education: Not on file   Highest education level: Not on file  Occupational History   Occupation: Roofer  Tobacco Use   Smoking status: Every Day    Current packs/day: 0.50    Types: Cigarettes   Smokeless tobacco: Never  Vaping Use   Vaping status: Never Used  Substance and Sexual Activity   Alcohol use: No   Drug use: Yes    Types: Marijuana   Sexual activity: Not on file  Other Topics Concern   Not on file  Social History Narrative   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  Physical Exam Vitals and nursing note reviewed.  Constitutional:      General: He is not in acute distress.    Appearance: He is well-developed.  HENT:     Head: Normocephalic and atraumatic.     Mouth/Throat:     Mouth: Mucous membranes are moist.     Pharynx: Oropharynx is clear. Uvula midline.  Eyes:     Conjunctiva/sclera: Conjunctivae normal.  Cardiovascular:     Rate and Rhythm: Normal rate and regular rhythm.     Pulses:          Posterior tibial pulses are 2+ on the right side and 2+ on the left side.     Heart sounds: No murmur heard.    Comments: HR 88/min Pulmonary:     Effort: Pulmonary effort is  normal. No respiratory distress.     Breath sounds: Normal breath sounds.  Abdominal:     Palpations: Abdomen is soft. There is no mass.     Tenderness: There is no abdominal tenderness.  Musculoskeletal:     Right lower leg: No edema.     Left lower leg: No edema.  Lymphadenopathy:     Cervical: No cervical adenopathy.  Skin:    General: Skin is warm.     Findings: No erythema or rash.  Neurological:     Mental Status: He is alert and oriented to person, place, and time.     Cranial Nerves: No cranial nerve deficit.     Gait: Gait normal.  Psychiatric:        Mood and Affect: Mood and affect normal.   ASSESSMENT AND PLAN:  Mr. Devon Cantu was seen today for chronic follow up.   Lab Results  Component Value Date   HGBA1C 6.1 01/31/2023   Lab Results  Component Value Date   CHOL 209 (H) 01/31/2023   HDL 40.70 01/31/2023   LDLCALC 142 (H) 01/31/2023   TRIG 127.0 01/31/2023   CHOLHDL 5 01/31/2023   The 10-year ASCVD risk score (Arnett DK, et al., 2019) is: 11%   Values used to calculate the score:     Age: 55 years     Sex: Male     Is Non-Hispanic African American: No     Diabetic: No     Tobacco smoker: Yes     Systolic Blood Pressure: 128 mmHg     Is BP treated: Yes     HDL Cholesterol: 40.7 mg/dL     Total Cholesterol: 209 mg/dL  Lab Results  Component Value Date   NA 139 01/31/2023   CL 100 01/31/2023   K 4.5 01/31/2023   CO2 28 01/31/2023   BUN 13 01/31/2023   CREATININE 0.85 01/31/2023   GFR 102.32 01/31/2023   CALCIUM  9.9 01/31/2023   ALBUMIN 4.7 01/31/2023   GLUCOSE 96 01/31/2023   Lab Results  Component Value Date   ALT 15 01/31/2023   AST 33 01/31/2023   ALKPHOS 92 01/31/2023   BILITOT 0.5 01/31/2023   Prediabetes Assessment & Plan: Last HgA1C 5.8 in 04/2020. Encouraged consistency with a healthy life style for diabetes prevention.  Orders: -     Hemoglobin A1c; Future  Mixed hyperlipidemia Assessment & Plan: Non pharmacologic treatment  recommended for now.  Further recommendations will be given according to 10 years CVD risk score and lipid panel numbers.  Orders: -     Comprehensive metabolic panel; Future -     Lipid panel; Future  Essential (primary) hypertension Assessment & Plan: Reporting home BP readings as good. Here today DBP mildly above goal. We discussed possible complications of elevated BP. Goal 120/70. Continue Amlodipine  5 mg daily and low salt/DASH diet. Eye exam is current. As far as BP is stable, annual follow up can continue.  Orders: -     amLODIPine  Besylate; Take 1 tablet (5 mg total) by mouth daily.  Dispense: 90 tablet; Refill: 3  Need for influenza vaccination -     Flu vaccine trivalent PF, 6mos and older(Flulaval,Afluria,Fluarix,Fluzone)   Return in about 1 year (around 01/31/2024) for CPE, chronic problems with PCP.  I, Leonce PARAS Wierda, acting as a scribe for Kyrene Longan, MD., have documented all relevant documentation on the behalf of Narjis Mira, MD, as directed by  Youcef Klas, MD while in the presence of Mliss Wedin, MD.   I, Jarrette Dehner, MD, have reviewed all documentation for this visit. The documentation on 01/31/23 for the exam, diagnosis, procedures, and orders are all accurate and complete.  Tinsley Lomas G. Donaciano Range, MD  [facility]. [place] office.

## 2023-01-31 NOTE — Assessment & Plan Note (Addendum)
 Reporting home BP readings as good. Here today DBP mildly above goal. We discussed possible complications of elevated BP. Goal 120/70. Continue Amlodipine  5 mg daily and low salt/DASH diet. Eye exam is current. As far as BP is stable, annual follow up can continue.

## 2023-01-31 NOTE — Assessment & Plan Note (Signed)
 Non pharmacologic treatment recommended for now. Further recommendations will be given according to 10 years CVD risk score and lipid panel numbers.

## 2023-01-31 NOTE — Patient Instructions (Addendum)
 A few things to remember from today's visit:  Prediabetes - Plan: Hemoglobin A1c  Essential (primary) hypertension - Plan: amLODipine  (NORVASC ) 5 MG tablet  Mixed hyperlipidemia - Plan: Comprehensive metabolic panel, Lipid panel  Continue monitoring blood pressure, goal 120/70's.  If you need refills for medications you take chronically, please call your pharmacy. Do not use My Chart to request refills or for acute issues that need immediate attention. If you send a my chart message, it may take a few days to be addressed, specially if I am not in the office.  Please be sure medication list is accurate. If a new problem present, please set up appointment sooner than planned today.

## 2023-02-06 ENCOUNTER — Telehealth: Payer: Self-pay

## 2023-02-06 NOTE — Telephone Encounter (Signed)
Noted, pt has f/u with PCP on 2/14.    Copied from CRM [id number]. Topic: Clinical - Medical Advice >> Feb 06, 2023  9:55 AM Elmarie Shiley H wrote: Patient's wife called to review lab results. Relayed as written. Patient's wife acknowledged Amlodipine. Please recommend an alternative to Rosuvastatin, as they cause leg cramps. Please assist.

## 2023-02-07 NOTE — Telephone Encounter (Signed)
Noted!  Please advise on statin

## 2023-02-07 NOTE — Telephone Encounter (Signed)
Spoke to pt spouse and she stated pt just saw Dr. [place] for this does he have to come into the office still. Please advise.

## 2023-02-08 NOTE — Telephone Encounter (Signed)
Pt has been scheduled.  °

## 2023-02-09 ENCOUNTER — Encounter: Payer: Self-pay | Admitting: Adult Health

## 2023-02-09 ENCOUNTER — Ambulatory Visit (INDEPENDENT_AMBULATORY_CARE_PROVIDER_SITE_OTHER): Payer: 59 | Admitting: Adult Health

## 2023-02-09 VITALS — BP 128/88 | HR 112 | Temp 98.3°F | Ht 67.0 in | Wt 169.0 lb

## 2023-02-09 DIAGNOSIS — E782 Mixed hyperlipidemia: Secondary | ICD-10-CM | POA: Diagnosis not present

## 2023-02-09 DIAGNOSIS — R7303 Prediabetes: Secondary | ICD-10-CM | POA: Diagnosis not present

## 2023-02-09 MED ORDER — ROSUVASTATIN CALCIUM 5 MG PO TABS: 5.0000 mg | ORAL_TABLET | Freq: Every day | ORAL | 3 refills | Status: DC

## 2023-02-09 NOTE — Patient Instructions (Signed)
I am going to place you on Crestor 5 mg daily.   Please cut back on sugars and carbs   Lets follow up in 3 months

## 2023-02-09 NOTE — Progress Notes (Signed)
Subjective:    Patient ID: Devon Cantu, male    DOB: Apr 15, 1975, 48 y.o.   MRN: [id number]  HPI 48 year old male who  has a past medical history of Elevated cholesterol and Splenic laceration.  He presents to the office today for follow up regarding lab work he had done by another provider in the office   His cholesterol panel was elevated and it was recommended he start statin therapy. Back in 2019 he was placed on simvastatin which caused muscle aches. He has not taken a statin since.   His A1c also showed he was prediabetic at 6.1. He does drink a lot of sodas and high carb diet.    Review of Systems See HPI   Past Medical History:  Diagnosis Date   Elevated cholesterol    Splenic laceration     Social History   Socioeconomic History   Marital status: Married    Spouse name: Not on file   Number of children: 3   Years of education: Not on file   Highest education level: Not on file  Occupational History   Occupation: Roofer  Tobacco Use   Smoking status: Every Day    Current packs/day: 0.50    Types: Cigarettes   Smokeless tobacco: Never  Vaping Use   Vaping status: Never Used  Substance and Sexual Activity   Alcohol use: No   Drug use: Yes    Types: Marijuana   Sexual activity: Not on file  Other Topics Concern   Not on file  Social History Narrative   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    Married    Social Drivers of Corporate investment banker Strain: Not on file  Food Insecurity: Not on file  Transportation Needs: Not on file  Physical Activity: Not on file  Stress: Not on file (11/24/2022)  Social Connections: Not on file  Intimate Partner Violence: Not on file    Past Surgical History:  Procedure Laterality Date    plevis     FRENULECTOMY, LINGUAL     ORIF PELVIC FRACTURE  2007    Family History  Problem Relation Age of Onset   High blood pressure Mother    Breast cancer Maternal Grandmother    Lung cancer Maternal Grandmother     Diabetes Maternal Grandfather    Alcohol abuse Maternal Grandfather    High blood pressure Maternal Grandfather    Kidney disease Maternal Grandfather    Cancer Paternal Grandmother        doesn't know which kind   Aneurysm Brother    Esophageal cancer Maternal Uncle    Colon cancer Neg Hx    Rectal cancer Neg Hx    Stomach cancer Neg Hx     No Known Allergies  Current Outpatient Medications on File Prior to Visit  Medication Sig Dispense Refill   amLODipine (NORVASC) 5 MG tablet Take 1 tablet (5 mg total) by mouth daily. 90 tablet 3   No current facility-administered medications on file prior to visit.    BP 128/88   Pulse (!) 112   Temp 98.3 F (36.8 C) (Oral)   Ht 5\' 7"  (1.702 m)   Wt 169 lb (76.7 kg)   SpO2 94%   BMI 26.47 kg/m       Objective:   Physical Exam Vitals and nursing note reviewed.  Constitutional:      Appearance: Normal appearance.  Cardiovascular:     Rate and Rhythm: Normal rate  and regular rhythm.     Pulses: Normal pulses.     Heart sounds: Normal heart sounds.  Pulmonary:     Effort: Pulmonary effort is normal.     Breath sounds: Normal breath sounds.  Musculoskeletal:        General: Normal range of motion.  Skin:    General: Skin is warm and dry.  Neurological:     General: No focal deficit present.     Mental Status: He is alert and oriented to person, place, and time.  Psychiatric:        Mood and Affect: Mood normal.        Behavior: Behavior normal.        Thought Content: Thought content normal.        Judgment: Judgment normal.       Assessment & Plan:  1. Mixed hyperlipidemia (Primary) - Will start on Crestor 5 mg. He follows up in one month for CPE  - rosuvastatin (CRESTOR) 5 MG tablet; Take 1 tablet (5 mg total) by mouth daily.  Dispense: 90 tablet; Refill: 3  2. Prediabetes - Encouraged stopping sodas and eating a low sugar or carb diet   Shirline Frees, NP

## 2023-03-03 ENCOUNTER — Encounter: Payer: 59 | Admitting: Adult Health

## 2023-03-03 ENCOUNTER — Ambulatory Visit (INDEPENDENT_AMBULATORY_CARE_PROVIDER_SITE_OTHER): Payer: 59 | Admitting: Adult Health

## 2023-03-03 ENCOUNTER — Encounter: Payer: Self-pay | Admitting: Adult Health

## 2023-03-03 VITALS — BP 138/80 | HR 90 | Temp 98.1°F | Ht 67.0 in | Wt 172.0 lb

## 2023-03-03 DIAGNOSIS — Z0000 Encounter for general adult medical examination without abnormal findings: Secondary | ICD-10-CM | POA: Diagnosis not present

## 2023-03-03 DIAGNOSIS — I10 Essential (primary) hypertension: Secondary | ICD-10-CM | POA: Diagnosis not present

## 2023-03-03 DIAGNOSIS — R7303 Prediabetes: Secondary | ICD-10-CM | POA: Diagnosis not present

## 2023-03-03 DIAGNOSIS — Z720 Tobacco use: Secondary | ICD-10-CM

## 2023-03-03 DIAGNOSIS — E782 Mixed hyperlipidemia: Secondary | ICD-10-CM

## 2023-03-03 DIAGNOSIS — Z125 Encounter for screening for malignant neoplasm of prostate: Secondary | ICD-10-CM

## 2023-03-03 MED ORDER — VARENICLINE TARTRATE 1 MG PO TABS: 1.0000 mg | ORAL_TABLET | Freq: Two times a day (BID) | ORAL | 3 refills | Status: AC

## 2023-03-03 MED ORDER — VARENICLINE TARTRATE (STARTER) 0.5 MG X 11 & 1 MG X 42 PO TBPK: ORAL_TABLET | ORAL | 0 refills | Status: DC

## 2023-03-03 NOTE — Addendum Note (Signed)
Addended by: Gertie Baron D on: 03/03/2023 04:25 PM   Modules accepted: Orders

## 2023-03-03 NOTE — Patient Instructions (Addendum)
It was great seeing you today   We will follow up with you regarding your lab work   Please let me know if you need anything   Follow up in 3 months and we will recheck your pre diabetes and cholesterol

## 2023-03-03 NOTE — Progress Notes (Signed)
Subjective:    Patient ID: Devon Cantu, male    DOB: [birth year], 48 y.o.   MRN: [id number]  HPI Patient presents for yearly preventative medicine examination. He is a pleasant 48 year old male who  has a past medical history of Elevated cholesterol and Splenic laceration.  Hyperlipidemia - started on Crestor about a month ago. He has not had any side effects of medication  Lab Results  Component Value Date   CHOL 209 (H) 01/31/2023   HDL 40.70 01/31/2023   LDLCALC 142 (H) 01/31/2023   TRIG 127.0 01/31/2023   CHOLHDL 5 01/31/2023   HTN - managed with Norvasc 5 mg daily.  BP Readings from Last 3 Encounters:  03/03/23 138/80  02/09/23 128/88  01/31/23 128/80   Tobacco Use - he wants to quit smoking. A pack lasts him about two days. He has been cutting back.   Prediabetes - he has started to cut back on sodas and is exercising more.  Lab Results  Component Value Date   HGBA1C 6.1 01/31/2023    All immunizations and health maintenance protocols were reviewed with the patient and needed orders were placed.  Appropriate screening laboratory values were ordered for the patient including screening of hyperlipidemia, renal function and hepatic function. If indicated by BPH, a PSA was ordered.  Medication reconciliation,  past medical history, social history, problem list and allergies were reviewed in detail with the patient  Goals were established with regard to weight loss, exercise, and  diet in compliance with medications  He is up to date on routine colon cancer screening   He has no acute complaints.   Review of Systems  Constitutional: Negative.   HENT: Negative.    Eyes: Negative.   Respiratory: Negative.    Cardiovascular: Negative.   Gastrointestinal: Negative.   Endocrine: Negative.   Genitourinary: Negative.   Musculoskeletal: Negative.   Skin: Negative.   Allergic/Immunologic: Negative.   Neurological: Negative.   Hematological: Negative.    Psychiatric/Behavioral: Negative.    All other systems reviewed and are negative.  Past Medical History:  Diagnosis Date   Elevated cholesterol    Splenic laceration     Social History   Socioeconomic History   Marital status: Married    Spouse name: Not on file   Number of children: 3   Years of education: Not on file   Highest education level: Not on file  Occupational History   Occupation: Roofer  Tobacco Use   Smoking status: Every Day    Current packs/day: 0.50    Types: Cigarettes   Smokeless tobacco: Never  Vaping Use   Vaping status: Never Used  Substance and Sexual Activity   Alcohol use: No   Drug use: Yes    Types: Marijuana   Sexual activity: Not on file  Other Topics Concern   Not on file  Social History Narrative   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    Married    Social Drivers of Corporate investment banker Strain: Not on file  Food Insecurity: Not on file  Transportation Needs: Not on file  Physical Activity: Not on file  Stress: Not on file (11/24/2022)  Social Connections: Not on file  Intimate Partner Violence: Not on file    Past Surgical History:  Procedure Laterality Date    plevis     FRENULECTOMY, LINGUAL     ORIF PELVIC FRACTURE  2007    Family History  Problem Relation Age of Onset  High blood pressure Mother    Breast cancer Maternal Grandmother    Lung cancer Maternal Grandmother    Diabetes Maternal Grandfather    Alcohol abuse Maternal Grandfather    High blood pressure Maternal Grandfather    Kidney disease Maternal Grandfather    Cancer Paternal Grandmother        doesn't know which kind   Aneurysm Brother    Esophageal cancer Maternal Uncle    Colon cancer Neg Hx    Rectal cancer Neg Hx    Stomach cancer Neg Hx     No Known Allergies  Current Outpatient Medications on File Prior to Visit  Medication Sig Dispense Refill   amLODipine (NORVASC) 5 MG tablet Take 1 tablet (5 mg total) by mouth daily. 90 tablet 3    rosuvastatin (CRESTOR) 5 MG tablet Take 1 tablet (5 mg total) by mouth daily. 90 tablet 3   No current facility-administered medications on file prior to visit.    BP 138/80   Pulse 90   Temp 98.1 F (36.7 C) (Oral)   Ht 5\' 7"  (1.702 m)   Wt 172 lb (78 kg)   SpO2 99%   BMI 26.94 kg/m       Objective:   Physical Exam Vitals and nursing note reviewed.  Constitutional:      General: He is not in acute distress.    Appearance: Normal appearance. He is not ill-appearing.  HENT:     Head: Normocephalic and atraumatic.     Right Ear: Tympanic membrane, ear canal and external ear normal. There is no impacted cerumen.     Left Ear: Tympanic membrane, ear canal and external ear normal. There is no impacted cerumen.     Nose: Nose normal. No congestion or rhinorrhea.     Mouth/Throat:     Mouth: Mucous membranes are moist.     Pharynx: Oropharynx is clear.  Eyes:     Extraocular Movements: Extraocular movements intact.     Conjunctiva/sclera: Conjunctivae normal.     Pupils: Pupils are equal, round, and reactive to light.  Neck:     Vascular: No carotid bruit.  Cardiovascular:     Rate and Rhythm: Normal rate and regular rhythm.     Pulses: Normal pulses.     Heart sounds: No murmur heard.    No friction rub. No gallop.  Pulmonary:     Effort: Pulmonary effort is normal.     Breath sounds: Normal breath sounds.  Abdominal:     General: Abdomen is flat. Bowel sounds are normal. There is no distension.     Palpations: Abdomen is soft. There is no mass.     Tenderness: There is no abdominal tenderness. There is no guarding or rebound.     Hernia: No hernia is present.  Musculoskeletal:        General: Normal range of motion.     Cervical back: Normal range of motion and neck supple.  Lymphadenopathy:     Cervical: No cervical adenopathy.  Skin:    General: Skin is warm and dry.     Capillary Refill: Capillary refill takes less than 2 seconds.  Neurological:     General:  No focal deficit present.     Mental Status: He is alert and oriented to person, place, and time.  Psychiatric:        Mood and Affect: Mood normal.        Behavior: Behavior normal.  Thought Content: Thought content normal.        Judgment: Judgment normal.        Assessment & Plan:  1. Routine general medical examination at a health care facility (Primary) Today patient counseled on age appropriate routine health concerns for screening and prevention, each reviewed and up to date or declined. Immunizations reviewed and up to date or declined. Labs ordered and reviewed. Risk factors for depression reviewed and negative. Hearing function and visual acuity are intact. ADLs screened and addressed as needed. Functional ability and level of safety reviewed and appropriate. Education, counseling and referrals performed based on assessed risks today. Patient provided with a copy of personalized plan for preventive services. - Follow up in one year or sooner if needed - Quit smoking  - Work on diet and exercise   2. Mixed hyperlipidemia - Will have him follow up in 3 months for repeat labs  - CBC; Future - Comprehensive metabolic panel; Future  3. Prediabetes - Continue to cut back on soda and carbs  - Follow up in 3 months  - CBC; Future - Comprehensive metabolic panel; Future  4. Essential (primary) hypertension - Controlled.  - CBC; Future - Comprehensive metabolic panel; Future  5. Tobacco use - I am glad he is ready to quit smoking. Will start him on Chantix  - Follow up in 3 months  - Varenicline Tartrate, Starter, (CHANTIX STARTING MONTH PAK) 0.5 MG X 11 & 1 MG X 42 TBPK; Use to quit smoke  Dispense: 53 each; Refill: 0 - varenicline (CHANTIX CONTINUING MONTH PAK) 1 MG tablet; Take 1 tablet (1 mg total) by mouth 2 (two) times daily.  Dispense: 60 tablet; Refill: 3  6. Prostate cancer screening  - PSA; Future  Shirline Frees, NP

## 2023-03-04 LAB — COMPREHENSIVE METABOLIC PANEL
AG Ratio: 1.7 (calc) (ref 1.0–2.5)
ALT: 19 U/L (ref 9–46)
AST: 33 U/L (ref 10–40)
Albumin: 4.3 g/dL (ref 3.6–5.1)
Alkaline phosphatase (APISO): 81 U/L (ref 36–130)
BUN: 11 mg/dL (ref 7–25)
CO2: 27 mmol/L (ref 20–32)
Calcium: 9.5 mg/dL (ref 8.6–10.3)
Chloride: 100 mmol/L (ref 98–110)
Creat: 0.78 mg/dL (ref 0.60–1.29)
Globulin: 2.6 g/dL (ref 1.9–3.7)
Glucose, Bld: 88 mg/dL (ref 65–99)
Potassium: 4.7 mmol/L (ref 3.5–5.3)
Sodium: 137 mmol/L (ref 135–146)
Total Bilirubin: 0.5 mg/dL (ref 0.2–1.2)
Total Protein: 6.9 g/dL (ref 6.1–8.1)

## 2023-03-04 LAB — CBC
HCT: 44 % (ref 38.5–50.0)
Hemoglobin: 15.3 g/dL (ref 13.2–17.1)
MCH: 29.6 pg (ref 27.0–33.0)
MCHC: 34.8 g/dL (ref 32.0–36.0)
MCV: 85.1 fL (ref 80.0–100.0)
MPV: 10.2 fL (ref 7.5–12.5)
Platelets: 280 10*3/uL (ref 140–400)
RBC: 5.17 10*6/uL (ref 4.20–5.80)
RDW: 12.9 % (ref 11.0–15.0)
WBC: 8.3 10*3/uL (ref 3.8–10.8)

## 2023-03-04 LAB — PSA: PSA: 1.53 ng/mL (ref <=4.00)

## 2023-03-13 ENCOUNTER — Ambulatory Visit: Payer: Self-pay | Admitting: Adult Health

## 2023-03-13 NOTE — Telephone Encounter (Signed)
 Copied from CRM [id number]. Topic: Clinical - Red Word Triage >> Mar 13, 2023  8:37 AM Theodis Sato wrote: Red Word that prompted transfer to Nurse Triage: Neck sores with some swelling - has been going on for about a week. Patients wife is speaking.  Chief Complaint: three ring worms to neck Symptoms: rash Frequency: constant Pertinent Negatives: Patient denies fever, headache Disposition: [] ED /[] Urgent Care (no appt availability in office) / [x] Appointment(In office/virtual)/ []  [facility]/ [] Home Care/ [] Refused Recommended Disposition /[] [facility]/ []  Follow-up with PCP Additional Notes: apt made for Thursday per protocol; care advice given, denies questions, instructed to go to UC if becomes worse.   Reason for Disposition  [1] On treatment > 1 week AND [2] rash continues to spread  Answer Assessment - Initial Assessment Questions 1. APPEARANCE of RASH: "What does the rash look like?"      Ring worms, round and bright red 2. LOCATION: "Where is the rash located?"      Neck  3. SIZE: "How large are the spots?"      Size of a quarter 4. NUMBER: "How many spots are there?"      three 5. ONSET: "When did the ringworm start?"     Last week 6. OTHER SYMPTOMS: "Do you have any other symptoms?" (e.g., fever, headache, etc.)     denies 7. PREGNANCY: "Is there any chance you are pregnant?" "When was your last menstrual period?"     na  Protocols used: Ringworm-A-AH

## 2023-03-13 NOTE — Telephone Encounter (Signed)
 Pt was transferred to nurse, pt had disconnected. RN attempted callback, no answer VM left.   Copied from CRM [id number]. Topic: Clinical - Red Word Triage >> Mar 13, 2023  8:37 AM Devon Cantu wrote: Red Word that prompted transfer to Nurse Triage: Neck sores with some swelling - has been going on for about a week. Patients wife is speaking.

## 2023-03-15 ENCOUNTER — Ambulatory Visit: Payer: 59 | Admitting: Adult Health

## 2023-03-16 ENCOUNTER — Ambulatory Visit: Payer: 59 | Admitting: Adult Health

## 2023-05-31 ENCOUNTER — Ambulatory Visit: Payer: 59 | Admitting: Adult Health

## 2023-05-31 NOTE — Progress Notes (Deleted)
 Subjective:    Patient ID: Devon Cantu, male    DOB: 1975-09-12, 48 y.o.   MRN: [id number]  HPI 48 year old male who  has a past medical history of Elevated cholesterol and Splenic laceration.  He presents to the office today for follow-up regarding hyperlipidemia and prediabetes.  He had labs done in January 2025 that showed an elevated cholesterol level and he was started on Crestor  10 mg daily.  He was also noted to prediabetic with an A1c of 6.1.  In the interim he has cut out sodas and is trying to eat healthier.  He does stay active with his job.  Lab Results  Component Value Date   CHOL 209 (H) 01/31/2023   HDL 40.70 01/31/2023   LDLCALC 142 (H) 01/31/2023   TRIG 127.0 01/31/2023   CHOLHDL 5 01/31/2023     Review of Systems See HPI   Past Medical History:  Diagnosis Date   Elevated cholesterol    Splenic laceration     Social History   Socioeconomic History   Marital status: Married    Spouse name: Not on file   Number of children: 3   Years of education: Not on file   Highest education level: Not on file  Occupational History   Occupation: Roofer  Tobacco Use   Smoking status: Every Day    Current packs/day: 0.50    Types: Cigarettes   Smokeless tobacco: Never  Vaping Use   Vaping status: Never Used  Substance and Sexual Activity   Alcohol use: No   Drug use: Yes    Types: Marijuana   Sexual activity: Not on file  Other Topics Concern   Not on file  Social History Narrative   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    Married    Social Drivers of Corporate investment banker Strain: Not on file  Food Insecurity: Not on file  Transportation Needs: Not on file  Physical Activity: Not on file  Stress: Not on file (11/24/2022)  Social Connections: Not on file  Intimate Partner Violence: Not on file    Past Surgical History:  Procedure Laterality Date    plevis     FRENULECTOMY, LINGUAL     ORIF PELVIC FRACTURE  2007    Family History   Problem Relation Age of Onset   High blood pressure Mother    Breast cancer Maternal Grandmother    Lung cancer Maternal Grandmother    Diabetes Maternal Grandfather    Alcohol abuse Maternal Grandfather    High blood pressure Maternal Grandfather    Kidney disease Maternal Grandfather    Cancer Paternal Grandmother        doesn't know which kind   Aneurysm Brother    Esophageal cancer Maternal Uncle    Colon cancer Neg Hx    Rectal cancer Neg Hx    Stomach cancer Neg Hx     No Known Allergies  Current Outpatient Medications on File Prior to Visit  Medication Sig Dispense Refill   amLODipine  (NORVASC ) 5 MG tablet Take 1 tablet (5 mg total) by mouth daily. 90 tablet 3   rosuvastatin  (CRESTOR ) 5 MG tablet Take 1 tablet (5 mg total) by mouth daily. 90 tablet 3   Varenicline  Tartrate, Starter, (CHANTIX  STARTING MONTH PAK) 0.5 MG X 11 & 1 MG X 42 TBPK Use to quit smoke 53 each 0   No current facility-administered medications on file prior to visit.    There were  no vitals taken for this visit.      Objective:   Physical Exam Vitals and nursing note reviewed.  Constitutional:      Appearance: Normal appearance.  Cardiovascular:     Rate and Rhythm: Normal rate and regular rhythm.     Pulses: Normal pulses.     Heart sounds: Normal heart sounds.  Pulmonary:     Effort: Pulmonary effort is normal.     Breath sounds: Normal breath sounds.  Musculoskeletal:        General: Normal range of motion.  Skin:    General: Skin is warm and dry.  Neurological:     General: No focal deficit present.     Mental Status: He is alert and oriented to person, place, and time.  Psychiatric:        Mood and Affect: Mood normal.        Behavior: Behavior normal.        Thought Content: Thought content normal.        Judgment: Judgment normal.        Assessment & Plan:

## 2023-06-02 ENCOUNTER — Ambulatory Visit: Payer: Self-pay | Admitting: Adult Health

## 2023-06-02 ENCOUNTER — Encounter: Payer: Self-pay | Admitting: Adult Health

## 2023-06-02 VITALS — BP 138/72 | HR 81 | Temp 98.2°F | Ht 67.0 in | Wt 170.0 lb

## 2023-06-02 DIAGNOSIS — E782 Mixed hyperlipidemia: Secondary | ICD-10-CM

## 2023-06-02 DIAGNOSIS — Z720 Tobacco use: Secondary | ICD-10-CM

## 2023-06-02 DIAGNOSIS — R7303 Prediabetes: Secondary | ICD-10-CM

## 2023-06-02 LAB — COMPREHENSIVE METABOLIC PANEL WITH GFR
ALT: 16 U/L (ref 0–53)
AST: 34 U/L (ref 0–37)
Albumin: 4.4 g/dL (ref 3.5–5.2)
Alkaline Phosphatase: 77 U/L (ref 39–117)
BUN: 14 mg/dL (ref 6–23)
CO2: 29 meq/L (ref 19–32)
Calcium: 9.6 mg/dL (ref 8.4–10.5)
Chloride: 104 meq/L (ref 96–112)
Creatinine, Ser: 0.78 mg/dL (ref 0.40–1.50)
GFR: 104.76 mL/min (ref >60.00)
Glucose, Bld: 106 mg/dL — ABNORMAL HIGH (ref 70–99)
Potassium: 4.3 meq/L (ref 3.5–5.1)
Sodium: 140 meq/L (ref 135–145)
Total Bilirubin: 0.4 mg/dL (ref 0.2–1.2)
Total Protein: 7.5 g/dL (ref 6.0–8.3)

## 2023-06-02 LAB — LIPID PANEL
Cholesterol: 126 mg/dL (ref 0–200)
HDL: 38.4 mg/dL — ABNORMAL LOW (ref >39.00)
LDL Cholesterol: 74 mg/dL (ref 0–99)
NonHDL: 87.47
Total CHOL/HDL Ratio: 3
Triglycerides: 68 mg/dL (ref 0.0–149.0)
VLDL: 13.6 mg/dL (ref 0.0–40.0)

## 2023-06-02 LAB — HEMOGLOBIN A1C: Hgb A1c MFr Bld: 5.9 % (ref 4.6–6.5)

## 2023-06-02 MED ORDER — BUPROPION HCL ER (XL) 150 MG PO TB24: 150.0000 mg | ORAL_TABLET | Freq: Every day | ORAL | 1 refills | Status: AC

## 2023-06-02 NOTE — Progress Notes (Signed)
 Subjective:    Patient ID: Devon Cantu, male    DOB: Apr 05, 1975, 48 y.o.   MRN: [id number]  HPI  48 year old male who  has a past medical history of Elevated cholesterol and Splenic laceration.  He presents to the office today for follow-up regarding hyperlipidemia and prediabetes.  He had labs done in January 2025 that showed an elevated cholesterol level and he was started on Crestor  10 mg daily.  Lab Results  Component Value Date   CHOL 209 (H) 01/31/2023   HDL 40.70 01/31/2023   LDLCALC 142 (H) 01/31/2023   TRIG 127.0 01/31/2023   CHOLHDL 5 01/31/2023   He was also noted to prediabetic with an A1c of 6.1.  In the interim he has cut out sodas and is trying to eat healthier.  He does stay active with his job.  Lab Results  Component Value Date   HGBA1C 6.1 01/31/2023   HGBA1C 5.8 04/24/2020   HGBA1C 5.7 09/28/2017   Tobacco Use - We placed him on Chantix  about three months ago. He reports today that he has cut back from 2 packs a day to 1 pack a day but did not find Chantix  helpful.  Review of Systems See HPI   Past Medical History:  Diagnosis Date   Elevated cholesterol    Splenic laceration     Social History   Socioeconomic History   Marital status: Married    Spouse name: Not on file   Number of children: 3   Years of education: Not on file   Highest education level: Not on file  Occupational History   Occupation: Roofer  Tobacco Use   Smoking status: Every Day    Current packs/day: 0.50    Types: Cigarettes   Smokeless tobacco: Never  Vaping Use   Vaping status: Never Used  Substance and Sexual Activity   Alcohol use: No   Drug use: Yes    Types: Marijuana   Sexual activity: Not on file  Other Topics Concern   Not on file  Social History Narrative   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    Married    Social Drivers of Corporate investment banker Strain: Not on file  Food Insecurity: Not on file  Transportation Needs: Not on file   Physical Activity: Not on file  Stress: Not on file (11/24/2022)  Social Connections: Not on file  Intimate Partner Violence: Not on file    Past Surgical History:  Procedure Laterality Date    plevis     FRENULECTOMY, LINGUAL     ORIF PELVIC FRACTURE  2007    Family History  Problem Relation Age of Onset   High blood pressure Mother    Breast cancer Maternal Grandmother    Lung cancer Maternal Grandmother    Diabetes Maternal Grandfather    Alcohol abuse Maternal Grandfather    High blood pressure Maternal Grandfather    Kidney disease Maternal Grandfather    Cancer Paternal Grandmother        doesn't know which kind   Aneurysm Brother    Esophageal cancer Maternal Uncle    Colon cancer Neg Hx    Rectal cancer Neg Hx    Stomach cancer Neg Hx     No Known Allergies  Current Outpatient Medications on File Prior to Visit  Medication Sig Dispense Refill   amLODipine  (NORVASC ) 5 MG tablet Take 1 tablet (5 mg total) by mouth daily. 90 tablet 3  rosuvastatin  (CRESTOR ) 5 MG tablet Take 1 tablet (5 mg total) by mouth daily. 90 tablet 3   Varenicline  Tartrate, Starter, (CHANTIX  STARTING MONTH PAK) 0.5 MG X 11 & 1 MG X 42 TBPK Use to quit smoke 53 each 0   No current facility-administered medications on file prior to visit.    BP 138/72   Pulse 81   Temp 98.2 F (36.8 C) (Oral)   Ht 5\' 7"  (1.702 m)   Wt 170 lb (77.1 kg)   SpO2 98%   BMI 26.63 kg/m       Objective:   Physical Exam Vitals and nursing note reviewed.  Constitutional:      Appearance: Normal appearance.  Cardiovascular:     Rate and Rhythm: Normal rate and regular rhythm.     Pulses: Normal pulses.     Heart sounds: Normal heart sounds.  Pulmonary:     Effort: Pulmonary effort is normal.     Breath sounds: Normal breath sounds.  Skin:    General: Skin is warm and dry.  Neurological:     General: No focal deficit present.     Mental Status: He is alert and oriented to person, place, and time.   Psychiatric:        Mood and Affect: Mood normal.        Behavior: Behavior normal.        Thought Content: Thought content normal.        Judgment: Judgment normal.       Assessment & Plan:   1. Mixed hyperlipidemia (Primary) - Consider increase in lipitor  - Lipid panel; Future - Comprehensive metabolic panel with GFR; Future  2. Prediabetes - Consider Metformin - Hemoglobin A1c; Future  3. Tobacco use - Will trial him on Wellbutrin  - buPROPion (WELLBUTRIN XL) 150 MG 24 hr tablet; Take 1 tablet (150 mg total) by mouth daily.  Dispense: 90 tablet; Refill: 1   Tylyn Stankovich, NP

## 2023-08-10 ENCOUNTER — Encounter: Payer: Self-pay | Admitting: Adult Health

## 2023-08-10 ENCOUNTER — Ambulatory Visit (INDEPENDENT_AMBULATORY_CARE_PROVIDER_SITE_OTHER): Payer: Self-pay | Admitting: Adult Health

## 2023-08-10 VITALS — BP 120/64 | HR 64 | Temp 98.3°F | Ht 67.0 in | Wt 160.0 lb

## 2023-08-10 DIAGNOSIS — M25551 Pain in right hip: Secondary | ICD-10-CM

## 2023-08-10 DIAGNOSIS — M25552 Pain in left hip: Secondary | ICD-10-CM

## 2023-08-10 MED ORDER — METHYLPREDNISOLONE 4 MG PO TBPK
ORAL_TABLET | ORAL | 0 refills | Status: AC
Start: 2023-08-10 — End: ?

## 2023-08-10 MED ORDER — CYCLOBENZAPRINE HCL 10 MG PO TABS: 10.0000 mg | ORAL_TABLET | Freq: Every day | ORAL | 0 refills | Status: DC

## 2023-08-10 NOTE — Progress Notes (Addendum)
 Subjective:    Patient ID: Devon Cantu, male    DOB: September 08, 1975, 48 y.o.   MRN: [id number]  HPI 48 year old male who  has a past medical history of Elevated cholesterol and Splenic laceration.  He presents to the office today for three weeks of left lower back/left hip pain that is felt as  jabbing.  He reports that about 3 weeks ago he was at work and stood up slowly but felt a sudden onset of low back pain.  He ended up going to urgent care since his pain was pretty significant prescribed Robaxin  for lumbar strain.  He reports that this pain eventually went away but the time of the original injury he has had pain closer to his left hip where he has had previous ORIF surgery.  Reports that the pain is pretty constant and significant.  Does not really have any pain with palpation but does endorse pain with laying down, standing, changing positions, twisting and bending.  Over-the-counter pain relief has not helped.  He finds it difficult to sleep at night due to not finding a comfortable position.  The only that he has found relief is by laying flat forming a butterfly stretch with his left leg.  Is not noticed any bruising.  Denies UTI-like symptoms.  No changes in bowel movements   Review of Systems See HPI   Past Medical History:  Diagnosis Date   Elevated cholesterol    Splenic laceration     Social History   Socioeconomic History   Marital status: Married    Spouse name: Not on file   Number of children: 3   Years of education: Not on file   Highest education level: Not on file  Occupational History   Occupation: Roofer  Tobacco Use   Smoking status: Every Day    Current packs/day: 0.50    Types: Cigarettes   Smokeless tobacco: Never  Vaping Use   Vaping status: Never Used  Substance and Sexual Activity   Alcohol use: No   Drug use: Yes    Types: Marijuana   Sexual activity: Not on file  Other Topics Concern   Not on file  Social History Narrative    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    Married    Social Drivers of Corporate investment banker Strain: Not on file  Food Insecurity: Not on file  Transportation Needs: Not on file  Physical Activity: Not on file  Stress: Not on file (11/24/2022)  Social Connections: Not on file  Intimate Partner Violence: Not on file    Past Surgical History:  Procedure Laterality Date    plevis     FRENULECTOMY, LINGUAL     ORIF PELVIC FRACTURE  2007    Family History  Problem Relation Age of Onset   High blood pressure Mother    Breast cancer Maternal Grandmother    Lung cancer Maternal Grandmother    Diabetes Maternal Grandfather    Alcohol abuse Maternal Grandfather    High blood pressure Maternal Grandfather    Kidney disease Maternal Grandfather    Cancer Paternal Grandmother        doesn't know which kind   Aneurysm Brother    Esophageal cancer Maternal Uncle    Colon cancer Neg Hx    Rectal cancer Neg Hx    Stomach cancer Neg Hx     No Known Allergies  Current Outpatient Medications on File Prior to Visit  Medication Sig  Dispense Refill   amLODipine  (NORVASC ) 5 MG tablet Take 1 tablet (5 mg total) by mouth daily. 90 tablet 3   buPROPion  (WELLBUTRIN  XL) 150 MG 24 hr tablet Take 1 tablet (150 mg total) by mouth daily. 90 tablet 1   rosuvastatin  (CRESTOR ) 5 MG tablet Take 1 tablet (5 mg total) by mouth daily. 90 tablet 3   No current facility-administered medications on file prior to visit.    BP 120/64   Pulse 64   Temp 98.3 F (36.8 C) (Oral)   Ht 5' 7 (1.702 m)   Wt 160 lb (72.6 kg)   SpO2 97%   BMI 25.06 kg/m       Objective:   Physical Exam Vitals and nursing note reviewed.  Constitutional:      Appearance: Normal appearance.  Musculoskeletal:        General: Normal range of motion.       Back:  Skin:    General: Skin is warm and dry.  Neurological:     General: No focal deficit present.     Mental Status: He is alert and oriented to person, place, and  time.  Psychiatric:        Mood and Affect: Mood normal.        Behavior: Behavior normal.        Thought Content: Thought content normal.        Judgment: Judgment normal.        Assessment & Plan:  1. Left hip pain (Primary) - Will xray low back and left hip to look at hardware. Consider advanced imaging.  - Will prescribed flexeril  and Medrol  dose pack to see if this helps with pain relief.  - DG Hip Unilat W OR W/O Pelvis 2-3 Views Left; Future - DG Lumbar Spine Complete; Future - methylPREDNISolone  (MEDROL  DOSEPAK) 4 MG TBPK tablet; Take as directed  Dispense: 21 tablet; Refill: 0 - cyclobenzaprine  (FLEXERIL ) 10 MG tablet; Take 1 tablet (10 mg total) by mouth at bedtime.  Dispense: 30 tablet; Refill: 0  Darleene Shape, NP

## 2023-08-11 ENCOUNTER — Ambulatory Visit: Payer: Self-pay

## 2023-08-11 ENCOUNTER — Other Ambulatory Visit: Payer: Self-pay

## 2023-08-11 DIAGNOSIS — M25551 Pain in right hip: Secondary | ICD-10-CM

## 2023-08-16 ENCOUNTER — Telehealth: Payer: Self-pay

## 2023-08-16 NOTE — Telephone Encounter (Signed)
 Pt notified that Xray is not back yet. Pt verbalized understanding.

## 2023-08-16 NOTE — Telephone Encounter (Signed)
 Copied from CRM [id number]. Topic: Clinical - Lab/Test Results >> Aug 16, 2023 10:34 AM Deleta RAMAN wrote: Reason for CRM: patient wife calling to see if there's any updates regarding xray on 7/25 please advise at [phone] leave voice message if possible.

## 2023-08-18 ENCOUNTER — Ambulatory Visit: Payer: Self-pay | Admitting: Adult Health

## 2023-08-18 NOTE — Telephone Encounter (Signed)
 Copied from CRM [id number]. Topic: Clinical - Lab/Test Results >> Aug 18, 2023 10:39 AM Lavanda D wrote: Reason for CRM: Patient x-ray results are in and his wife is waiting for a call back to discuss.

## 2023-08-18 NOTE — Telephone Encounter (Unsigned)
 Copied from CRM [id number]. Topic: Clinical - Lab/Test Results >> Aug 18, 2023  2:17 PM Burnard DEL wrote: Reason for CRM: Patients wife returned call to [facility] regarding xray results. Message from provider was relayed.Wife stated that he is still having pain in his back and side,the same place. Rating the pain about a 6

## 2023-08-18 NOTE — Telephone Encounter (Signed)
**Note De-identified  [place] Obfuscation** Please advise 

## 2023-09-05 ENCOUNTER — Encounter: Payer: Self-pay | Admitting: Adult Health

## 2023-09-05 NOTE — Progress Notes (Deleted)
   Subjective:    Patient ID: Devon Cantu, male    DOB: Oct 12, 1975, 48 y.o.   MRN: [id number]  HPI    Review of Systems     Objective:   Physical Exam        Assessment & Plan:

## 2023-09-06 ENCOUNTER — Other Ambulatory Visit: Payer: Self-pay | Admitting: Adult Health

## 2023-09-06 DIAGNOSIS — M25552 Pain in left hip: Secondary | ICD-10-CM

## 2023-10-05 ENCOUNTER — Other Ambulatory Visit: Payer: Self-pay | Admitting: Adult Health

## 2023-10-05 DIAGNOSIS — M25552 Pain in left hip: Secondary | ICD-10-CM

## 2023-11-23 ENCOUNTER — Other Ambulatory Visit: Payer: Self-pay | Admitting: Adult Health

## 2023-11-23 DIAGNOSIS — E782 Mixed hyperlipidemia: Secondary | ICD-10-CM

## 2023-11-23 DIAGNOSIS — I10 Essential (primary) hypertension: Secondary | ICD-10-CM

## 2023-11-23 MED ORDER — AMLODIPINE BESYLATE 5 MG PO TABS: 5.0000 mg | ORAL_TABLET | Freq: Every day | ORAL | 3 refills | Status: AC

## 2023-11-23 MED ORDER — ROSUVASTATIN CALCIUM 5 MG PO TABS: 5.0000 mg | ORAL_TABLET | Freq: Every day | ORAL | 3 refills | Status: AC

## 2023-11-23 NOTE — Telephone Encounter (Signed)
 Copied from CRM #[id number]. Topic: Clinical - Medication Refill >> Nov 23, 2023  9:59 AM Franky GRADE wrote: Medication: rosuvastatin  (CRESTOR ) 5 MG tablet [[id number]] & amLODipine  (NORVASC ) 5 MG tablet [[id number]]  Has the patient contacted their pharmacy? Yes,They asked patient to contact the office.  (Agent: If no, request that the patient contact the pharmacy for the refill. If patient does not wish to contact the pharmacy document the reason why and proceed with request.) (Agent: If yes, when and what did the pharmacy advise?)  This is the patient's preferred pharmacy:  [facility] [phone], [place] - [address] [place] [address] Phone: [phone] Fax: [phone]  Is this the correct pharmacy for this prescription? Yes If no, delete pharmacy and type the correct one.   Has the prescription been filled recently? No  Is the patient out of the medication? Yes  Has the patient been seen for an appointment in the last year OR does the patient have an upcoming appointment? Yes  Can we respond through MyChart? No, patient does not use MyChart.   Agent: Please be advised that Rx refills may take up to 3 business days. We ask that you follow-up with your pharmacy.
